# Patient Record
Sex: Female | Born: 1950 | Race: White | Hispanic: No | Marital: Married | Smoking: Current some day smoker
Health system: Southern US, Community
[De-identification: ages and names within clinical notes are randomized; demographics above are authoritative.]

## PROBLEM LIST (undated history)

## (undated) DIAGNOSIS — E7800 Pure hypercholesterolemia, unspecified: Secondary | ICD-10-CM

## (undated) DIAGNOSIS — N816 Rectocele: Secondary | ICD-10-CM

## (undated) DIAGNOSIS — I10 Essential (primary) hypertension: Secondary | ICD-10-CM

## (undated) DIAGNOSIS — N951 Menopausal and female climacteric states: Secondary | ICD-10-CM

## (undated) DIAGNOSIS — M8580 Other specified disorders of bone density and structure, unspecified site: Secondary | ICD-10-CM

## (undated) DIAGNOSIS — D219 Benign neoplasm of connective and other soft tissue, unspecified: Secondary | ICD-10-CM

## (undated) DIAGNOSIS — N200 Calculus of kidney: Secondary | ICD-10-CM

## (undated) DIAGNOSIS — IMO0002 Reserved for concepts with insufficient information to code with codable children: Secondary | ICD-10-CM

## (undated) HISTORY — DX: Rectocele: N81.6

## (undated) HISTORY — DX: Reserved for concepts with insufficient information to code with codable children: IMO0002

## (undated) HISTORY — DX: Pure hypercholesterolemia, unspecified: E78.00

## (undated) HISTORY — DX: Menopausal and female climacteric states: N95.1

## (undated) HISTORY — PX: BREAST SURGERY: SHX581

## (undated) HISTORY — PX: AUGMENTATION MAMMAPLASTY: SUR837

## (undated) HISTORY — PX: COLONOSCOPY: SHX174

## (undated) HISTORY — DX: Other specified disorders of bone density and structure, unspecified site: M85.80

## (undated) HISTORY — PX: TONSILLECTOMY: SUR1361

## (undated) HISTORY — PX: OTHER SURGICAL HISTORY: SHX169

## (undated) HISTORY — PX: WISDOM TOOTH EXTRACTION: SHX21

## (undated) HISTORY — PX: TUBAL LIGATION: SHX77

## (undated) HISTORY — DX: Benign neoplasm of connective and other soft tissue, unspecified: D21.9

---

## 1990-02-22 HISTORY — PX: VAGINAL HYSTERECTOMY: SUR661

## 1997-08-09 ENCOUNTER — Ambulatory Visit (HOSPITAL_COMMUNITY): Admission: RE | Admit: 1997-08-09 | Discharge: 1997-08-09 | Payer: Self-pay | Admitting: Gastroenterology

## 1998-02-13 ENCOUNTER — Encounter: Payer: Self-pay | Admitting: Obstetrics and Gynecology

## 1998-02-13 ENCOUNTER — Ambulatory Visit (HOSPITAL_COMMUNITY): Admission: RE | Admit: 1998-02-13 | Discharge: 1998-02-13 | Payer: Self-pay | Admitting: Obstetrics and Gynecology

## 1998-12-30 ENCOUNTER — Other Ambulatory Visit: Admission: RE | Admit: 1998-12-30 | Discharge: 1998-12-30 | Payer: Self-pay | Admitting: Obstetrics and Gynecology

## 1999-02-20 ENCOUNTER — Ambulatory Visit (HOSPITAL_COMMUNITY): Admission: RE | Admit: 1999-02-20 | Discharge: 1999-02-20 | Payer: Self-pay | Admitting: Obstetrics and Gynecology

## 1999-02-20 ENCOUNTER — Encounter: Payer: Self-pay | Admitting: Obstetrics and Gynecology

## 1999-02-23 HISTORY — PX: TOTAL HIP ARTHROPLASTY: SHX124

## 2000-02-22 ENCOUNTER — Encounter: Payer: Self-pay | Admitting: Obstetrics and Gynecology

## 2000-02-22 ENCOUNTER — Ambulatory Visit (HOSPITAL_COMMUNITY): Admission: RE | Admit: 2000-02-22 | Discharge: 2000-02-22 | Payer: Self-pay | Admitting: Obstetrics and Gynecology

## 2001-03-06 ENCOUNTER — Other Ambulatory Visit: Admission: RE | Admit: 2001-03-06 | Discharge: 2001-03-06 | Payer: Self-pay | Admitting: Obstetrics and Gynecology

## 2001-03-09 ENCOUNTER — Ambulatory Visit (HOSPITAL_COMMUNITY): Admission: RE | Admit: 2001-03-09 | Discharge: 2001-03-09 | Payer: Self-pay | Admitting: Obstetrics and Gynecology

## 2001-03-09 ENCOUNTER — Encounter: Payer: Self-pay | Admitting: Obstetrics and Gynecology

## 2002-03-07 ENCOUNTER — Other Ambulatory Visit: Admission: RE | Admit: 2002-03-07 | Discharge: 2002-03-07 | Payer: Self-pay | Admitting: Obstetrics and Gynecology

## 2002-03-13 ENCOUNTER — Encounter: Payer: Self-pay | Admitting: Obstetrics and Gynecology

## 2002-03-13 ENCOUNTER — Ambulatory Visit (HOSPITAL_COMMUNITY): Admission: RE | Admit: 2002-03-13 | Discharge: 2002-03-13 | Payer: Self-pay | Admitting: Obstetrics and Gynecology

## 2003-02-23 HISTORY — PX: OTHER SURGICAL HISTORY: SHX169

## 2003-03-18 ENCOUNTER — Other Ambulatory Visit: Admission: RE | Admit: 2003-03-18 | Discharge: 2003-03-18 | Payer: Self-pay | Admitting: Obstetrics and Gynecology

## 2003-03-19 ENCOUNTER — Ambulatory Visit (HOSPITAL_COMMUNITY): Admission: RE | Admit: 2003-03-19 | Discharge: 2003-03-19 | Payer: Self-pay | Admitting: Obstetrics and Gynecology

## 2004-03-19 ENCOUNTER — Other Ambulatory Visit: Admission: RE | Admit: 2004-03-19 | Discharge: 2004-03-19 | Payer: Self-pay | Admitting: Obstetrics and Gynecology

## 2004-03-30 ENCOUNTER — Ambulatory Visit (HOSPITAL_COMMUNITY): Admission: RE | Admit: 2004-03-30 | Discharge: 2004-03-30 | Payer: Self-pay | Admitting: Obstetrics and Gynecology

## 2005-03-23 ENCOUNTER — Other Ambulatory Visit: Admission: RE | Admit: 2005-03-23 | Discharge: 2005-03-23 | Payer: Self-pay | Admitting: Obstetrics and Gynecology

## 2005-04-05 ENCOUNTER — Ambulatory Visit (HOSPITAL_COMMUNITY): Admission: RE | Admit: 2005-04-05 | Discharge: 2005-04-05 | Payer: Self-pay | Admitting: Obstetrics and Gynecology

## 2005-10-26 ENCOUNTER — Encounter: Admission: RE | Admit: 2005-10-26 | Discharge: 2005-10-26 | Payer: Self-pay | Admitting: Otolaryngology

## 2006-04-19 ENCOUNTER — Ambulatory Visit (HOSPITAL_COMMUNITY): Admission: RE | Admit: 2006-04-19 | Discharge: 2006-04-19 | Payer: Self-pay | Admitting: Obstetrics and Gynecology

## 2006-04-19 ENCOUNTER — Other Ambulatory Visit: Admission: RE | Admit: 2006-04-19 | Discharge: 2006-04-19 | Payer: Self-pay | Admitting: Obstetrics and Gynecology

## 2006-10-05 ENCOUNTER — Ambulatory Visit: Payer: Self-pay | Admitting: Gastroenterology

## 2006-10-19 ENCOUNTER — Ambulatory Visit: Payer: Self-pay | Admitting: Gastroenterology

## 2006-10-19 ENCOUNTER — Encounter: Payer: Self-pay | Admitting: Gastroenterology

## 2007-04-28 ENCOUNTER — Ambulatory Visit (HOSPITAL_COMMUNITY): Admission: RE | Admit: 2007-04-28 | Discharge: 2007-04-28 | Payer: Self-pay | Admitting: Obstetrics and Gynecology

## 2007-05-03 ENCOUNTER — Other Ambulatory Visit: Admission: RE | Admit: 2007-05-03 | Discharge: 2007-05-03 | Payer: Self-pay | Admitting: Obstetrics and Gynecology

## 2008-05-07 ENCOUNTER — Ambulatory Visit (HOSPITAL_COMMUNITY): Admission: RE | Admit: 2008-05-07 | Discharge: 2008-05-07 | Payer: Self-pay | Admitting: Obstetrics and Gynecology

## 2008-05-21 ENCOUNTER — Other Ambulatory Visit: Admission: RE | Admit: 2008-05-21 | Discharge: 2008-05-21 | Payer: Self-pay | Admitting: Obstetrics and Gynecology

## 2008-05-21 ENCOUNTER — Encounter: Payer: Self-pay | Admitting: Obstetrics and Gynecology

## 2008-05-21 ENCOUNTER — Ambulatory Visit: Payer: Self-pay | Admitting: Obstetrics and Gynecology

## 2009-05-13 ENCOUNTER — Encounter: Admission: RE | Admit: 2009-05-13 | Discharge: 2009-05-13 | Payer: Self-pay | Admitting: Obstetrics and Gynecology

## 2009-05-27 ENCOUNTER — Ambulatory Visit: Payer: Self-pay | Admitting: Obstetrics and Gynecology

## 2009-05-27 ENCOUNTER — Other Ambulatory Visit: Admission: RE | Admit: 2009-05-27 | Discharge: 2009-05-27 | Payer: Self-pay | Admitting: Obstetrics and Gynecology

## 2010-03-15 ENCOUNTER — Encounter: Payer: Self-pay | Admitting: Obstetrics and Gynecology

## 2010-04-10 ENCOUNTER — Other Ambulatory Visit: Payer: Self-pay | Admitting: Obstetrics and Gynecology

## 2010-04-10 DIAGNOSIS — Z1231 Encounter for screening mammogram for malignant neoplasm of breast: Secondary | ICD-10-CM

## 2010-05-19 ENCOUNTER — Ambulatory Visit
Admission: RE | Admit: 2010-05-19 | Discharge: 2010-05-19 | Disposition: A | Discharge status: Home / Self Care | Payer: BC Managed Care – PPO | Source: Ambulatory Visit | Attending: Obstetrics and Gynecology | Admitting: Obstetrics and Gynecology

## 2010-05-19 DIAGNOSIS — Z1231 Encounter for screening mammogram for malignant neoplasm of breast: Secondary | ICD-10-CM

## 2010-05-21 ENCOUNTER — Other Ambulatory Visit: Payer: Self-pay | Admitting: Family Medicine

## 2010-05-21 DIAGNOSIS — R928 Other abnormal and inconclusive findings on diagnostic imaging of breast: Secondary | ICD-10-CM

## 2010-05-26 ENCOUNTER — Inpatient Hospital Stay: Admission: RE | Admit: 2010-05-26 | Payer: BC Managed Care – PPO | Source: Ambulatory Visit

## 2010-06-09 ENCOUNTER — Other Ambulatory Visit (HOSPITAL_COMMUNITY)
Admission: RE | Admit: 2010-06-09 | Discharge: 2010-06-09 | Disposition: A | Discharge status: Home / Self Care | Payer: BC Managed Care – PPO | Source: Ambulatory Visit | Attending: Obstetrics and Gynecology | Admitting: Obstetrics and Gynecology

## 2010-06-09 ENCOUNTER — Other Ambulatory Visit: Payer: Self-pay | Admitting: Obstetrics and Gynecology

## 2010-06-09 ENCOUNTER — Encounter (INDEPENDENT_AMBULATORY_CARE_PROVIDER_SITE_OTHER): Payer: BC Managed Care – PPO | Admitting: Obstetrics and Gynecology

## 2010-06-09 DIAGNOSIS — Z01419 Encounter for gynecological examination (general) (routine) without abnormal findings: Secondary | ICD-10-CM

## 2010-06-09 DIAGNOSIS — B373 Candidiasis of vulva and vagina: Secondary | ICD-10-CM

## 2010-06-09 DIAGNOSIS — Z124 Encounter for screening for malignant neoplasm of cervix: Secondary | ICD-10-CM | POA: Insufficient documentation

## 2010-06-09 DIAGNOSIS — R82998 Other abnormal findings in urine: Secondary | ICD-10-CM

## 2010-09-29 ENCOUNTER — Telehealth: Payer: Self-pay

## 2010-09-29 MED ORDER — NONFORMULARY OR COMPOUNDED ITEM: Status: DC

## 2010-09-29 MED ORDER — KETOCONAZOLE 200 MG PO TABS: 200.0000 mg | ORAL_TABLET | Freq: Two times a day (BID) | ORAL | Status: AC

## 2010-09-29 NOTE — Telephone Encounter (Signed)
PT. NOTOFIED AND RX'S CALLED TO [facility]. ALL INFO. PUT IN PTS. WORK VMAIL TOO PER HER REQUEST [id number] [id number]

## 2010-09-29 NOTE — Telephone Encounter (Signed)
PT. C-O PERSISTENT YEAST SYMPTOMS-EXTERNAL ITCHING & WHITE -TO -YELLOWISH DISCHARGE, SLIGHT ODOR. TERAZOL 7 & DIFLUCAN ALWAYS HELP BUT SYMPTOMS ALWAYS RETURN. REQUESTING STRONGER GENERIC RX. IF OV NEEDED, THIS IS HER BUSIEST TIME OF YEAR & WOULDN'T BE ABLE TO SEE YOU UNTIL NEXT WEEK.

## 2010-09-29 NOTE — Telephone Encounter (Signed)
  Please treat the patient with the Nizoral 200 mg twice a day for 7 days. Also give her generic mytrex cream to use twice a day. Also suggest to start oral rephresh 3 times a week.

## 2010-10-27 ENCOUNTER — Other Ambulatory Visit: Payer: Self-pay | Admitting: Obstetrics and Gynecology

## 2010-12-09 ENCOUNTER — Telehealth: Payer: Self-pay | Admitting: *Deleted

## 2010-12-09 NOTE — Telephone Encounter (Signed)
Pt called c/o chronic yeast. I advised OV since she hasn t  been here since April. She stated has been treated every so often for 2 years and I again advised it would be best to have OV due to her symptoms. She stated cant come in til 10/29-10/31. I transferred to appts. KW

## 2010-12-15 DIAGNOSIS — M8580 Other specified disorders of bone density and structure, unspecified site: Secondary | ICD-10-CM | POA: Insufficient documentation

## 2010-12-15 DIAGNOSIS — N951 Menopausal and female climacteric states: Secondary | ICD-10-CM | POA: Insufficient documentation

## 2010-12-15 DIAGNOSIS — IMO0002 Reserved for concepts with insufficient information to code with codable children: Secondary | ICD-10-CM | POA: Insufficient documentation

## 2010-12-15 DIAGNOSIS — D219 Benign neoplasm of connective and other soft tissue, unspecified: Secondary | ICD-10-CM | POA: Insufficient documentation

## 2010-12-15 DIAGNOSIS — N816 Rectocele: Secondary | ICD-10-CM | POA: Insufficient documentation

## 2010-12-21 ENCOUNTER — Ambulatory Visit (INDEPENDENT_AMBULATORY_CARE_PROVIDER_SITE_OTHER): Payer: BC Managed Care – PPO | Admitting: Obstetrics and Gynecology

## 2010-12-21 ENCOUNTER — Encounter: Payer: Self-pay | Admitting: Obstetrics and Gynecology

## 2010-12-21 DIAGNOSIS — R300 Dysuria: Secondary | ICD-10-CM

## 2010-12-21 DIAGNOSIS — R1031 Right lower quadrant pain: Secondary | ICD-10-CM

## 2010-12-21 DIAGNOSIS — N898 Other specified noninflammatory disorders of vagina: Secondary | ICD-10-CM

## 2010-12-21 DIAGNOSIS — R82998 Other abnormal findings in urine: Secondary | ICD-10-CM

## 2010-12-21 DIAGNOSIS — B373 Candidiasis of vulva and vagina: Secondary | ICD-10-CM

## 2010-12-21 NOTE — Progress Notes (Signed)
Patient came to see me today for problem visit. She continues to have what she thinks is recurrent yeast infections. She has tried both Diflucan and terconazole. She is also noticed today some right lower quadrant pain for the first time.  Physical exam: Kennon Portela present. Abdomen: Soft without guarding rebound or masses. Pelvic: External and vaginal within normal limits. Cervix and uterus surgically absent. Adnexa fails to reveal masses. Wet prep positive for yeast. Urinalysis abnormal.  Assessment: #1. Yeast vaginitis #2. Possible UTI #3. Right lower quadrant pain  Plan: Patient treat her with boric acid suppositories 600 mg capsules one at bedtime in the vagina for 2 weeks and then 2-3 times a week for maintenance. Patient to get blood glucose checked at PCP. Call tomorrow for urine culture results. If right lower quadrant pain persists schedule ultrasound.

## 2010-12-23 ENCOUNTER — Ambulatory Visit: Payer: BC Managed Care – PPO | Admitting: Obstetrics and Gynecology

## 2011-05-31 ENCOUNTER — Other Ambulatory Visit: Payer: Self-pay | Admitting: Obstetrics and Gynecology

## 2011-05-31 DIAGNOSIS — Z1231 Encounter for screening mammogram for malignant neoplasm of breast: Secondary | ICD-10-CM

## 2011-06-10 ENCOUNTER — Encounter: Payer: BC Managed Care – PPO | Admitting: Obstetrics and Gynecology

## 2011-06-15 ENCOUNTER — Encounter: Payer: Self-pay | Admitting: Obstetrics and Gynecology

## 2011-06-15 ENCOUNTER — Ambulatory Visit (INDEPENDENT_AMBULATORY_CARE_PROVIDER_SITE_OTHER): Payer: BC Managed Care – PPO | Admitting: Obstetrics and Gynecology

## 2011-06-15 VITALS — BP 124/80 | Ht 60.0 in | Wt 156.0 lb

## 2011-06-15 DIAGNOSIS — E7800 Pure hypercholesterolemia, unspecified: Secondary | ICD-10-CM | POA: Insufficient documentation

## 2011-06-15 DIAGNOSIS — M899 Disorder of bone, unspecified: Secondary | ICD-10-CM

## 2011-06-15 DIAGNOSIS — Z01419 Encounter for gynecological examination (general) (routine) without abnormal findings: Secondary | ICD-10-CM

## 2011-06-15 DIAGNOSIS — M8580 Other specified disorders of bone density and structure, unspecified site: Secondary | ICD-10-CM

## 2011-06-15 MED ORDER — KETOCONAZOLE 200 MG PO TABS: 200.0000 mg | ORAL_TABLET | Freq: Every day | ORAL | Status: AC

## 2011-06-15 MED ORDER — ESTRADIOL 0.5 MG PO TABS: 0.5000 mg | ORAL_TABLET | Freq: Every day | ORAL | Status: DC

## 2011-06-15 MED ORDER — DIAZEPAM 10 MG PO TABS: 10.0000 mg | ORAL_TABLET | Freq: Four times a day (QID) | ORAL | Status: AC | PRN

## 2011-06-15 NOTE — Progress Notes (Signed)
Patient came to see me today for her annual GYN exam. She is doing well on her HRT. She has been able to taper it 3 times a week. She is currently not sexually active. She is having no vaginal bleeding. She is having no pelvic pain. She takes Valium for stress the we provide her with good results. She is scheduled yearly mammogram. Her last bone density was in 2009 and she had low bone mass without an elevated FRAX risk. She takes calcium and vitamin D. She has had no fractures. She does have a rectocele the we've been watching. She will occasionally have trouble eliminating  stool but not enough to require surgery. She has a cystocele which is asymptomatic. She does her lab through her PCP. She has had a persistent heavy cottage cheese like discharge which has been yeast in the past. It is not associated with any itching. It did not respond to Diflucan or terconazole. We tried boric acid but she found it very messy and  Only slightly effective.  HEENT: Within normal limits. Kennon Portela present. Neck: No masses. Supraclavicular lymph nodes: Not enlarged. Breasts: Examined in both sitting and lying position. Symmetrical without skin changes or masses. Abdomen: Soft no masses guarding or rebound. No hernias. Pelvic: External within normal limits. BUS within normal limits. Vaginal examination shows good estrogen effect, 1-1/2 cystocele and second-degree rectocele. Cervix and uterus absent. Adnexa within normal limits. Rectovaginal confirmatory. Extremities within normal limits.  Assessment: #1. Menopausal symptoms #2. Low bone mass 3. Yeast vaginitis #4. Cystocele #5. Rectocele  Plan: Mammogram. Bone density. Nizoral for one week. Continue Estrace half a milligram 3 times a week. Observation of cystocele and rectocele. Continue Valium prescription written.

## 2011-06-16 LAB — URINALYSIS W MICROSCOPIC + REFLEX CULTURE
Bacteria, UA: NONE SEEN
Bilirubin Urine: NEGATIVE
Casts: NONE SEEN
Hgb urine dipstick: NEGATIVE
Ketones, ur: NEGATIVE mg/dL
Leukocytes, UA: NEGATIVE
Protein, ur: NEGATIVE mg/dL
Urobilinogen, UA: 0.2 mg/dL (ref 0.0–1.0)

## 2011-06-22 ENCOUNTER — Inpatient Hospital Stay: Admission: RE | Admit: 2011-06-22 | Payer: BC Managed Care – PPO | Source: Ambulatory Visit

## 2011-06-29 ENCOUNTER — Ambulatory Visit
Admission: RE | Admit: 2011-06-29 | Discharge: 2011-06-29 | Disposition: A | Discharge status: Home / Self Care | Payer: BC Managed Care – PPO | Source: Ambulatory Visit

## 2011-06-29 ENCOUNTER — Ambulatory Visit: Payer: BC Managed Care – PPO

## 2011-06-29 DIAGNOSIS — Z1231 Encounter for screening mammogram for malignant neoplasm of breast: Secondary | ICD-10-CM

## 2011-10-21 ENCOUNTER — Encounter: Payer: Self-pay | Admitting: Gastroenterology

## 2012-02-01 ENCOUNTER — Ambulatory Visit (INDEPENDENT_AMBULATORY_CARE_PROVIDER_SITE_OTHER): Payer: BC Managed Care – PPO | Admitting: Obstetrics and Gynecology

## 2012-02-01 DIAGNOSIS — R229 Localized swelling, mass and lump, unspecified: Secondary | ICD-10-CM

## 2012-02-01 DIAGNOSIS — R2230 Localized swelling, mass and lump, unspecified upper limb: Secondary | ICD-10-CM

## 2012-02-01 DIAGNOSIS — IMO0002 Reserved for concepts with insufficient information to code with codable children: Secondary | ICD-10-CM

## 2012-02-01 DIAGNOSIS — N816 Rectocele: Secondary | ICD-10-CM

## 2012-02-01 DIAGNOSIS — N8111 Cystocele, midline: Secondary | ICD-10-CM

## 2012-02-01 DIAGNOSIS — R1032 Left lower quadrant pain: Secondary | ICD-10-CM

## 2012-02-01 NOTE — Progress Notes (Signed)
Patient came to see me today with 2 concerns. #1 she is noticed in the past month a fullness in both her axilla. She had a normal mammogram in May. She thinks her weight is constant. She has minimal mastodynia bilaterally. She has also noticed off-and-on for the past year some left lower quadrant pain near her ovary. She is status post vaginal hysterectomy done for fibroids. She has no GI or GU symptoms. She does have a rectocele and she is aware of difficulty emptying her rectum.  Exam: Kennon Portela present. Both her breasts were carefully examined in both the sitting and lying position. There are no masses. I appreciate very little axillary thickness but there is clearly no masses.  Pelvic: External within normal limits. Vaginal examination reveals a first-degree cystocele and a second-degree rectocele. Cervix and uterus are surgically absent. Bimanual and rectal fails to reveal masses. There is a suggestion of a left inguinal hernia when she stands.  Assessment: #1. Change in axillary exam. #2. Cystocele/rectocele #3. Left lower quadrant pain #4. Possible left inguinal hernia  Plan: Pelvic ultrasound. Pending results we will probably send her to a surgeon.

## 2012-02-01 NOTE — Patient Instructions (Signed)
Schedule ultrasound

## 2012-02-08 ENCOUNTER — Other Ambulatory Visit: Payer: Self-pay | Admitting: Obstetrics and Gynecology

## 2012-02-08 DIAGNOSIS — R1032 Left lower quadrant pain: Secondary | ICD-10-CM

## 2012-02-09 ENCOUNTER — Ambulatory Visit (INDEPENDENT_AMBULATORY_CARE_PROVIDER_SITE_OTHER): Payer: BC Managed Care – PPO

## 2012-02-09 ENCOUNTER — Ambulatory Visit (INDEPENDENT_AMBULATORY_CARE_PROVIDER_SITE_OTHER): Payer: BC Managed Care – PPO | Admitting: Obstetrics and Gynecology

## 2012-02-09 ENCOUNTER — Other Ambulatory Visit: Payer: Self-pay | Admitting: Obstetrics and Gynecology

## 2012-02-09 DIAGNOSIS — K5900 Constipation, unspecified: Secondary | ICD-10-CM

## 2012-02-09 DIAGNOSIS — R1032 Left lower quadrant pain: Secondary | ICD-10-CM

## 2012-02-09 DIAGNOSIS — N83339 Acquired atrophy of ovary and fallopian tube, unspecified side: Secondary | ICD-10-CM

## 2012-02-09 MED ORDER — AZITHROMYCIN 250 MG PO TABS: ORAL_TABLET | ORAL | Status: DC

## 2012-02-09 NOTE — Progress Notes (Signed)
Patient came back today for a ultrasound due  to left lower quadrant pain. Her uterus is surgically absent. Her right ovary is normal. There is no adnexal mass on the left but the ovary could not be seen. Part of the reason for this was increase bowel activity with shadowing from bowel. Obviously a postmenopausal ovary is not always seen. Her cul-de-sac was free of fluid.  Assessment: Left lower quadrant pain. Possible irritable bowel syndrome. Possible left inguinal hernia( see last office visit).  Plan: Surgical consult to rule out hernia. If this is not the reason for her pain she will discuss GI function with her medical doctor. The patient was developing an upper respiratory infection with significant nasal discharge. She was treated with a Z-Pak. She will see her PCP if it persists.

## 2012-02-09 NOTE — Patient Instructions (Addendum)
Make an appointment to see Dr. Abbey Chatters to get his opinion on whether you have her hernia. He is in [place] [facility].

## 2012-03-09 ENCOUNTER — Other Ambulatory Visit: Payer: Self-pay | Admitting: Gynecology

## 2012-03-09 ENCOUNTER — Other Ambulatory Visit: Payer: Self-pay

## 2012-06-01 ENCOUNTER — Encounter: Payer: Self-pay | Admitting: Gastroenterology

## 2012-06-02 ENCOUNTER — Other Ambulatory Visit: Payer: Self-pay | Admitting: Family Medicine

## 2012-06-02 DIAGNOSIS — Z1231 Encounter for screening mammogram for malignant neoplasm of breast: Secondary | ICD-10-CM

## 2012-06-06 ENCOUNTER — Encounter: Payer: Self-pay | Admitting: Gastroenterology

## 2012-06-20 ENCOUNTER — Encounter: Payer: BC Managed Care – PPO | Admitting: Gynecology

## 2012-07-04 ENCOUNTER — Encounter: Payer: Self-pay | Admitting: Gynecology

## 2012-07-04 ENCOUNTER — Ambulatory Visit (INDEPENDENT_AMBULATORY_CARE_PROVIDER_SITE_OTHER): Payer: BC Managed Care – PPO | Admitting: Gynecology

## 2012-07-04 VITALS — BP 130/86 | Ht 60.0 in | Wt 156.0 lb

## 2012-07-04 DIAGNOSIS — M8580 Other specified disorders of bone density and structure, unspecified site: Secondary | ICD-10-CM

## 2012-07-04 DIAGNOSIS — N8110 Cystocele, unspecified: Secondary | ICD-10-CM

## 2012-07-04 DIAGNOSIS — N816 Rectocele: Secondary | ICD-10-CM

## 2012-07-04 DIAGNOSIS — N898 Other specified noninflammatory disorders of vagina: Secondary | ICD-10-CM

## 2012-07-04 DIAGNOSIS — M899 Disorder of bone, unspecified: Secondary | ICD-10-CM

## 2012-07-04 DIAGNOSIS — N8111 Cystocele, midline: Secondary | ICD-10-CM

## 2012-07-04 DIAGNOSIS — Z1159 Encounter for screening for other viral diseases: Secondary | ICD-10-CM

## 2012-07-04 MED ORDER — NONFORMULARY OR COMPOUNDED ITEM: Status: DC

## 2012-07-04 MED ORDER — DIAZEPAM 10 MG PO TABS: 10.0000 mg | ORAL_TABLET | Freq: Four times a day (QID) | ORAL | Status: DC | PRN

## 2012-07-04 NOTE — Patient Instructions (Addendum)

## 2012-07-05 NOTE — Progress Notes (Signed)
Kathy [name] May 01, 1950 [id number]   History:    62 y.o. presented to the office today for her annual exam. Patient states that for quite some time to that she has had on and off yeast infections but since she stopped eating-year-old her symptoms haven't improved immensely. She had been on estrogen replacement therapy for 10 years for menopausal symptoms for which she was taken Estrace half a milligram 3 times a day and started to taper off. Patient scheduled for mammogram next week. 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 Patient has a history of a first degree cystocele and second-degree rectocele and has been asymptomatic. Her colonoscopy is scheduled for this June she states her last bone was normal 6 years ago. Patient takes Xanax 0.25 mg when necessary anxiety. Her last bone density study was in 2009 whereby her lowest T score was in the AP spine with a value of -1.6. Patient denies any prior history of abnormal Pap smears. Patient states her shingles, Pneumovax and dTap Vaccine are up-to-date.  Past medical history,surgical history, family history and social history were all reviewed and documented in the EPIC chart.  Gynecologic History No LMP recorded. Patient has had a hysterectomy. Contraception: status post hysterectomy Last Pap: 2012. Results were: normal Last mam normal 2013  Obstetric History OB History   Grav Para Term Preterm Abortions TAB SAB Ect Mult Living   3 2   1     2      # Outc Date GA Lbr Len/2nd Wgt Sex Del Anes PTL Lv   1 PAR            2 PAR            3 ABT                ROS: A ROS was performed and pertinent positives and negatives are included in the history.  GENERAL: No fevers or chills. HEENT: No change in vision, no earache, sore throat or sinus congestion. NECK: No pain or stiffness. CARDIOVASCULAR: No chest pain or pressure. No palpitations. PULMONARY: No shortness of breath, cough or wheeze.  GASTROINTESTINAL: No abdominal pain, nausea, vomiting or diarrhea, melena or bright red blood per rectum. GENITOURINARY: No urinary frequency, urgency, hesitancy or dysuria. MUSCULOSKELETAL: No joint or muscle pain, no back pain, no recent trauma. DERMATOLOGIC: No rash, no itching, no lesions. ENDOCRINE: No polyuria, polydipsia, no heat or cold intolerance. No recent change in weight. HEMATOLOGICAL: No anemia or easy bruising or bleeding. NEUROLOGIC: No headache, seizures, numbness, tingling or weakness. PSYCHIATRIC: No depression, no loss of interest in normal activity or change in sleep pattern.     Exam: chaperone present  BP 130/86  Ht 5' (1.524 m)  Wt 156 lb (70.761 kg)  BMI 30.47 kg/m2  Body mass index is 30.47 kg/(m^2).  General appearance : Well developed well nourished female. No acute distress HEENT: Neck supple, trachea midline, no carotid bruits, no thyroidmegaly Lungs: Clear to auscultation, no rhonchi or wheezes, or rib retractions  Heart: Regular rate and rhythm, no murmurs or gallops Breast:Examined in sitting and supine position were symmetrical in appearance, no palpable masses or tenderness,  no skin retraction, no nipple inversion, no nipple discharge, no skin discoloration, no axillary or supraclavicular lymphadenopathy Abdomen: no palpable masses or tenderness, no rebound or guarding Extremities: no edema or skin discoloration or tenderness  Pelvic:  Bartholin, Urethra, Skene Glands: Within normal limits  Vagina: No gross lesions or discharge, atrophic changes, first degree cystocele second-degree rectocele  Cervix: absent  Uterus Absent  Adnexa  Without masses or tenderness  Anus and perineum  normal   Rectovaginal  normal sphincter tone without palpated masses or tenderness             Hemoccult Cards provided     Assessment/Plan:  62 y.o. female for annual exam who was reminded to followup with her mammogram in the next few weeks. She also will  need to schedule a bone density study since is overdue. She was reminded to followup with her colonoscopy in June. Hemoccult cards were provided her to submit to the office for testing. We discussed importance of calcium vitamin D and weightbearing exercises for osteoporosis prevention. No Pap smear done today the new screening guidelines discussed.  New CDC guidelines is recommending patients be tested once in her lifetime for hepatitis C antibody who were born between [birth year] through 1967. This was discussed with the patient today and has agreed to be tested today.  For her vaginal atrophy she was prescribed vaginal estradiol 0.02% 1 mL prefilled applicator twice a week. The risk benefits and pros and cons were discussed as well.  Ok Edwards MD, 8:03 AM 07/05/2012

## 2012-07-07 ENCOUNTER — Encounter: Payer: Self-pay | Admitting: Obstetrics and Gynecology

## 2012-07-11 ENCOUNTER — Other Ambulatory Visit: Payer: Self-pay | Admitting: Family Medicine

## 2012-07-11 ENCOUNTER — Ambulatory Visit: Payer: BC Managed Care – PPO

## 2012-07-11 DIAGNOSIS — Z1231 Encounter for screening mammogram for malignant neoplasm of breast: Secondary | ICD-10-CM

## 2012-07-12 ENCOUNTER — Ambulatory Visit (AMBULATORY_SURGERY_CENTER): Payer: BC Managed Care – PPO | Admitting: *Deleted

## 2012-07-12 VITALS — Ht 61.0 in | Wt 158.2 lb

## 2012-07-12 DIAGNOSIS — Z1211 Encounter for screening for malignant neoplasm of colon: Secondary | ICD-10-CM

## 2012-07-12 MED ORDER — MOVIPREP 100 G PO SOLR: ORAL | Status: DC

## 2012-07-13 ENCOUNTER — Encounter: Payer: Self-pay | Admitting: Gastroenterology

## 2012-07-26 ENCOUNTER — Encounter: Payer: BC Managed Care – PPO | Admitting: Gastroenterology

## 2012-08-28 ENCOUNTER — Ambulatory Visit (AMBULATORY_SURGERY_CENTER): Payer: BC Managed Care – PPO | Admitting: Gastroenterology

## 2012-08-28 ENCOUNTER — Encounter: Payer: Self-pay | Admitting: Gastroenterology

## 2012-08-28 VITALS — BP 140/85 | HR 61 | Temp 96.8°F | Resp 15 | Ht 61.0 in | Wt 158.0 lb

## 2012-08-28 DIAGNOSIS — D126 Benign neoplasm of colon, unspecified: Secondary | ICD-10-CM

## 2012-08-28 DIAGNOSIS — K5730 Diverticulosis of large intestine without perforation or abscess without bleeding: Secondary | ICD-10-CM

## 2012-08-28 DIAGNOSIS — Z86010 Personal history of colonic polyps: Secondary | ICD-10-CM

## 2012-08-28 DIAGNOSIS — Z1211 Encounter for screening for malignant neoplasm of colon: Secondary | ICD-10-CM

## 2012-08-28 MED ORDER — SODIUM CHLORIDE 0.9 % IV SOLN: 500.0000 mL | INTRAVENOUS | Status: DC

## 2012-08-28 NOTE — Progress Notes (Signed)
Procedure ends, to recovery, report given and VSS. 

## 2012-08-28 NOTE — Patient Instructions (Addendum)
YOU HAD AN ENDOSCOPIC PROCEDURE TODAY AT THE [name]: Refer to the procedure report that was given to you for any specific questions about what was found during the examination.  If the procedure report does not answer your questions, please call your gastroenterologist to clarify.  If you requested that your care partner not be given the details of your procedure findings, then the procedure report has been included in a sealed envelope for you to review at your convenience later.  YOU SHOULD EXPECT: Some feelings of bloating in the abdomen. Passage of more gas than usual.  Walking can help get rid of the air that was put into your GI tract during the procedure and reduce the bloating. If you had a lower endoscopy (such as a colonoscopy or flexible sigmoidoscopy) you may notice spotting of blood in your stool or on the toilet paper. If you underwent a bowel prep for your procedure, then you may not have a normal bowel movement for a few days.  DIET: Your first meal following the procedure should be a light meal and then it is ok to progress to your normal diet.  A half-sandwich or bowl of soup is an example of a good first meal.  Heavy or fried foods are harder to digest and may make you feel nauseous or bloated.  Likewise meals heavy in dairy and vegetables can cause extra gas to form and this can also increase the bloating.  Drink plenty of fluids but you should avoid alcoholic beverages for 24 hours.  ACTIVITY: Your care partner should take you home directly after the procedure.  You should plan to take it easy, moving slowly for the rest of the day.  You can resume normal activity the day after the procedure however you should NOT DRIVE or use heavy machinery for 24 hours (because of the sedation medicines used during the test).    SYMPTOMS TO REPORT IMMEDIATELY: A gastroenterologist can be reached at any hour.  During normal business hours, 8:30 AM to 5:00 PM Monday through Friday,  call [phone].  After hours and on weekends, please call the GI answering service at [phone] who will take a message and have the physician on call contact you.   Following lower endoscopy (colonoscopy or flexible sigmoidoscopy):  Excessive amounts of blood in the stool  Significant tenderness or worsening of abdominal pains  Swelling of the abdomen that is new, acute  Fever of 100F or higher   FOLLOW UP: If any biopsies were taken you will be contacted by phone or by letter within the next 1-3 weeks.  Call your gastroenterologist if you have not heard about the biopsies in 3 weeks.  Our staff will call the home number listed on your records the next business day following your procedure to check on you and address any questions or concerns that you may have at that time regarding the information given to you following your procedure. This is a courtesy call and so if there is no answer at the home number and we have not heard from you through the emergency physician on call, we will assume that you have returned to your regular daily activities without incident.  SIGNATURES/CONFIDENTIALITY: You and/or your care partner have signed paperwork which will be entered into your electronic medical record.  These signatures attest to the fact that that the information above on your After Visit Summary has been reviewed and is understood.  Full responsibility of the confidentiality of   this discharge information lies with you and/or your care-partner.   Resume medications. Information given on polyps,diverticulosis and high fiber diet with discharge instructions. 

## 2012-08-28 NOTE — Progress Notes (Signed)
Called to room to assist during endoscopic procedure.  Patient ID and intended procedure confirmed with present staff. Received instructions for my participation in the procedure from the performing physician.  

## 2012-08-28 NOTE — Op Note (Signed)
[facility] [address] [place], [address]   COLONOSCOPY PROCEDURE REPORT  PATIENT: Kathy [name], Kathy [name].  MR#: [id number] BIRTHDATE: 1950-04-27 , 62  yrs. old GENDER: Female ENDOSCOPIST: Mardella Layman, MD, [facility] REFERRED BY: PROCEDURE DATE:  08/28/2012 PROCEDURE:   Colonoscopy with biopsy ASA CLASS:   Class II INDICATIONS:Patient's personal history of adenomatous colon polyps.  MEDICATIONS: Propofol (Diprivan) 240 mg IV  DESCRIPTION OF PROCEDURE:   After the risks and benefits and of the procedure were explained, informed consent was obtained.  A digital rectal exam revealed no abnormalities of the rectum.    The LB [id number]  endoscope was introduced through the anus and advanced to the cecum, which was identified by both the appendix and ileocecal valve .  The quality of the prep was adequate. .  The instrument was then slowly withdrawn as the colon was fully examined.     COLON FINDINGS: There was mild scattered diverticulosis noted.   The colon was redundant.  Manual abdominal counter-pressure was used to reach the cecum.   Multiple diminutive smooth flat polyps were found in the rectum.  Multiple biopsies were performed.   The colon was otherwise normal.  There was no diverticulosis, inflammation, polyps or cancers unless previously stated.     Retroflexed views revealed no abnormalities.     The scope was then withdrawn from the patient and the procedure completed.  COMPLICATIONS: There were no complications. ENDOSCOPIC IMPRESSION: 1.   There was mild diverticulosis noted 2.   The colon was redundant 3.   Multiple diminutive flat polyps were found in the rectum; multiple biopsies were performed   ?? hyperplastic polyps. 4.   The colon was otherwise normal  RECOMMENDATIONS: 1.  Repeat colonoscopy in 5 years if polyp adenomatous; otherwise 10 years 2.  Continue current medications 3.  High fiber diet   REPEAT  EXAM:  cc:  _______________________________ eSignedMardella Layman, MD, [facility] 08/28/2012 9:31 AM     PATIENT NAME:  [name]. MR#: [id number]

## 2012-08-28 NOTE — Progress Notes (Signed)
Patient did not experience any of the following events: a burn prior to discharge; a fall within the facility; wrong site/side/patient/procedure/implant event; or a hospital transfer or hospital admission upon discharge from the facility. (G8907) Patient did not have preoperative order for IV antibiotic SSI prophylaxis. (G8918)  

## 2012-08-29 ENCOUNTER — Telehealth: Payer: Self-pay

## 2012-08-29 NOTE — Telephone Encounter (Signed)
Phone number not in service

## 2012-09-01 ENCOUNTER — Encounter: Payer: Self-pay | Admitting: Gastroenterology

## 2012-09-21 ENCOUNTER — Telehealth: Payer: Self-pay | Admitting: Gastroenterology

## 2012-09-21 NOTE — Telephone Encounter (Signed)
Pt is unhappy about her bill for her recent COLON done on 08/28/12. Her bill is for $1200, I believe and she states she is not going to pay it. She states it says she's got Diverticulitis and she's never been told anything like that before. Pt states she never got a report after her procedure or was told anything. I asked if she received her Pre Cert letter and she stated no. After a long conversation, informed pt I will send her her 3 COLON reports that I have and she may call when she receives them and we will discuss them and she stated understanding. 1st COLON 01/02/2001 that was normal except she has a family hx of polyps. 2nd COLON 10/19/2006 polyps found meaning every COLON after will be diagnostic and not screening. Recent COLON 08/28/12 was diagnostic and mild diverticulosis and polyps found. Mailed info to pt.

## 2012-10-26 ENCOUNTER — Telehealth: Payer: Self-pay | Admitting: Gastroenterology

## 2012-10-27 NOTE — Telephone Encounter (Signed)
Called pt to speak about her claim and she thanked me for calling, but she is with a client and can't talk now. She did receive my information I mailed per per 09/21/12. She will either call me back or check with her insurance.

## 2012-12-11 ENCOUNTER — Telehealth: Payer: Self-pay | Admitting: *Deleted

## 2012-12-11 MED ORDER — ESTRADIOL 1 MG PO TABS: 1.0000 mg | ORAL_TABLET | Freq: Every day | ORAL | Status: DC

## 2012-12-11 NOTE — Telephone Encounter (Signed)
Please tell her that I just got from the [facility] in [place] and would like to share with her several new treatment options for the menopause. Make a consult appointment for her.

## 2012-12-11 NOTE — Telephone Encounter (Signed)
Pt is in [place] and wont be able to make appointment until 3-4 weeks. This time of year is very busy. She asked could you please give rx, she cant wait 3-4 week from now. Please advise

## 2012-12-11 NOTE — Telephone Encounter (Signed)
Please call her and prescription for Estrace 1 mg by mouth daily #30  1  refill and  appointment to see me in 3-4 weeks

## 2012-12-11 NOTE — Telephone Encounter (Signed)
rx sent, pt informed.  

## 2012-12-11 NOTE — Telephone Encounter (Signed)
Pt called c/o terrible hot flashes, night sweats, trouble with sleeping, pt would to start back on HRT. Please advise

## 2013-04-11 ENCOUNTER — Other Ambulatory Visit: Payer: Self-pay | Admitting: Gynecology

## 2013-06-05 ENCOUNTER — Other Ambulatory Visit: Payer: Self-pay | Admitting: Family Medicine

## 2013-06-05 DIAGNOSIS — Z1231 Encounter for screening mammogram for malignant neoplasm of breast: Secondary | ICD-10-CM

## 2013-07-10 ENCOUNTER — Ambulatory Visit (INDEPENDENT_AMBULATORY_CARE_PROVIDER_SITE_OTHER): Payer: BC Managed Care – PPO | Admitting: Gynecology

## 2013-07-10 ENCOUNTER — Encounter: Payer: Self-pay | Admitting: Gynecology

## 2013-07-10 VITALS — BP 126/82 | Ht 60.25 in | Wt 153.0 lb

## 2013-07-10 DIAGNOSIS — F411 Generalized anxiety disorder: Secondary | ICD-10-CM

## 2013-07-10 DIAGNOSIS — N816 Rectocele: Secondary | ICD-10-CM

## 2013-07-10 DIAGNOSIS — N952 Postmenopausal atrophic vaginitis: Secondary | ICD-10-CM

## 2013-07-10 DIAGNOSIS — M8580 Other specified disorders of bone density and structure, unspecified site: Secondary | ICD-10-CM

## 2013-07-10 DIAGNOSIS — M949 Disorder of cartilage, unspecified: Secondary | ICD-10-CM

## 2013-07-10 DIAGNOSIS — F419 Anxiety disorder, unspecified: Secondary | ICD-10-CM

## 2013-07-10 DIAGNOSIS — M899 Disorder of bone, unspecified: Secondary | ICD-10-CM

## 2013-07-10 MED ORDER — NONFORMULARY OR COMPOUNDED ITEM: Status: DC

## 2013-07-10 MED ORDER — DIAZEPAM 10 MG PO TABS: 10.0000 mg | ORAL_TABLET | Freq: Four times a day (QID) | ORAL | Status: DC | PRN

## 2013-07-10 NOTE — Progress Notes (Signed)
SIGNA CHEEK 1950-10-15 [id number]   History:    63 y.o.  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 Patient has a history of a first degree cystocele and second-degree rectocele and has complaining now of having to insert her finger to completely evacuate. She is occasionally sexually active she has had 2 vaginal deliveries in the past. She is completely off her hormone replacement therapy now she takes Valium 10 mg when necessary for anxiety. She is overdue for her bone density study the last one was in 2009 which demonstrated her lowest T score was in the AP spine with a value of -1.6 with normal FRAX.  She had a colonoscopy this year and she has informed me that they found benign polyps.  Patient's PCP is Dr. Maceo Pro   Past medical history,surgical history, family history and social history were all reviewed and documented in the EPIC chart.  Gynecologic History No LMP recorded. Patient has had a hysterectomy. Contraception: status post hysterectomy Last Pap: 2012. Results were: normal Last mammogram: 2014. Results were: normal  Obstetric History OB History  Gravida Para Term Preterm AB SAB TAB Ectopic Multiple Living  3 2   1     2     # Outcome Date GA Lbr Len/2nd Weight Sex Delivery Anes PTL Lv  3 ABT           2 PAR           1 PAR                ROS: A ROS was performed and pertinent positives and negatives are included in the history.  GENERAL: No fevers or chills. HEENT: No change in vision, no earache, sore throat or sinus congestion. NECK: No pain or stiffness. CARDIOVASCULAR: No chest pain or pressure. No palpitations. PULMONARY: No shortness of breath, cough or wheeze. GASTROINTESTINAL: No abdominal pain, nausea, vomiting or diarrhea, melena or bright red blood per rectum. GENITOURINARY: No urinary frequency, urgency, hesitancy or dysuria. MUSCULOSKELETAL: No joint or muscle pain, no back pain, no  recent trauma. DERMATOLOGIC: No rash, no itching, no lesions. ENDOCRINE: No polyuria, polydipsia, no heat or cold intolerance. No recent change in weight. HEMATOLOGICAL: No anemia or easy bruising or bleeding. NEUROLOGIC: No headache, seizures, numbness, tingling or weakness. PSYCHIATRIC: No depression, no loss of interest in normal activity or change in sleep pattern.     Exam: chaperone present  BP 126/82  Ht 5' 0.25" (1.53 m)  Wt 153 lb (69.4 kg)  BMI 29.65 kg/m2  Body mass index is 29.65 kg/(m^2).  General appearance : Well developed well nourished female. No acute distress HEENT: Neck supple, trachea midline, no carotid bruits, no thyroidmegaly Lungs: Clear to auscultation, no rhonchi or wheezes, or rib retractions  Heart: Regular rate and rhythm, no murmurs or gallops Breast:Examined in sitting and supine position were symmetrical in appearance, no palpable masses or tenderness,  no skin retraction, no nipple inversion, no nipple discharge, no skin discoloration, no axillary or supraclavicular lymphadenopathy Abdomen: no palpable masses or tenderness, no rebound or guarding Extremities: no edema or skin discoloration or tenderness  Pelvic:  Bartholin, Urethra, Skene Glands: Within normal limits             Vagina: First degree cystocele second degree rectocele vaginal atrophy  Cervix: Absent  Uterus absent  Adnexa  Without masses or tenderness  Anus and perineum  normal   Rectovaginal  normal sphincter  tone without palpated masses or tenderness             Hemoccult PCP provides    Assessment/Plan:  63 y.o. female with first-degree cystocele and symptomatic second-degree rectocele along with history of constipation. Patient will be placed on MiraLax 1 tablespoon to take daily. She will be started on vaginal estrogen to apply twice a week and she will return back to the office in 6 months for followup exam. I've given her literature information on cystocele and rectocele and the  vent and in 6 months we may need to proceed with a rectocele repair. She has silicone implants and has a mammogram scheduled in the next week have recommended that she requests for a three-dimensional mammogram. A requisition schedule her bone density study was also provided. Pap smear was not done today in accordance to the new guidelines.    Note: This dictation was prepared with  Dragon/digital dictation along withSmart phrase technology. Any transcriptional errors that result from this process are unintentional.   [name] MD, 4:57 PM 07/10/2013

## 2013-07-11 ENCOUNTER — Telehealth: Payer: Self-pay | Admitting: *Deleted

## 2013-07-11 NOTE — Telephone Encounter (Signed)
Pt called to get the name of the medication to take for constipation. Per note MiraLax 1 tablespoon to take daily pt informed.

## 2013-07-24 ENCOUNTER — Ambulatory Visit: Payer: BC Managed Care – PPO

## 2013-07-24 DIAGNOSIS — Z1231 Encounter for screening mammogram for malignant neoplasm of breast: Secondary | ICD-10-CM

## 2013-08-06 ENCOUNTER — Telehealth: Payer: Self-pay | Admitting: *Deleted

## 2013-08-06 ENCOUNTER — Ambulatory Visit: Payer: BC Managed Care – PPO | Admitting: Gynecology

## 2013-08-06 NOTE — Telephone Encounter (Signed)
Typing error, previous note should read I can see her today at 4 PM when I get back to surgery

## 2013-08-06 NOTE — Telephone Encounter (Signed)
Pt calling to follow up from [place] 07/10/13 still having constipation, taking stool softerns and taking MiraLax 1 tablespoon to take daily. Pt works with [employer] unable to come in today. Pt said front desk said scheduled was booked this week, pt does want OV with you. Please advise

## 2013-08-06 NOTE — Telephone Encounter (Signed)
Front desk will contact patient to schedule.

## 2013-08-06 NOTE — Telephone Encounter (Signed)
She can start with a fleets enema she can by over the counter or Dulcolax suppository twice a day. I would like to prescribe her Linzess 145 mcg to take one tablet 30 mins before first meal. #30 refill x 5. If persist she will need to see her GI.

## 2013-08-06 NOTE — Telephone Encounter (Signed)
Pt called back stating she has blisters in vaginal/rectal area that are painful as well. Okay to have front desk to work in?

## 2013-08-06 NOTE — Telephone Encounter (Signed)
[place] I can see her this afternoon when I get back to her surgery she can be here at 4:00

## 2013-08-07 ENCOUNTER — Ambulatory Visit (INDEPENDENT_AMBULATORY_CARE_PROVIDER_SITE_OTHER): Payer: BC Managed Care – PPO | Admitting: Gynecology

## 2013-08-07 ENCOUNTER — Encounter: Payer: Self-pay | Admitting: Gynecology

## 2013-08-07 VITALS — BP 132/88

## 2013-08-07 DIAGNOSIS — L98499 Non-pressure chronic ulcer of skin of other sites with unspecified severity: Secondary | ICD-10-CM

## 2013-08-07 DIAGNOSIS — K5900 Constipation, unspecified: Secondary | ICD-10-CM

## 2013-08-07 MED ORDER — LIDOCAINE HCL 2 % EX GEL: 1.0000 "application " | CUTANEOUS | Status: DC | PRN

## 2013-08-07 MED ORDER — LINACLOTIDE 145 MCG PO CAPS: 145.0000 ug | ORAL_CAPSULE | Freq: Every day | ORAL | Status: DC

## 2013-08-07 MED ORDER — FLUCONAZOLE 150 MG PO TABS: 150.0000 mg | ORAL_TABLET | Freq: Once | ORAL | Status: DC

## 2013-08-07 MED ORDER — CLOBETASOL PROPIONATE 0.05 % EX CREA: 1.0000 "application " | TOPICAL_CREAM | Freq: Two times a day (BID) | CUTANEOUS | Status: DC

## 2013-08-07 NOTE — Patient Instructions (Signed)
Linaclotide oral capsules What is this medicine? LINACLOTIDE (lin a KLOE tide) is used to treat irritable bowel syndrome (IBS) with constipation as the main problem. It may also be used for relief of chronic constipation. This medicine may be used for other purposes; ask your health care provider or pharmacist if you have questions. COMMON BRAND NAME(S): Linzess What should I tell my health care provider before I take this medicine? They need to know if you have any of these conditions: -history of stool (fecal) impaction -now have diarrhea or have diarrhea often -other medical condition -stomach or intestinal disease, including bowel obstruction or abdominal adhesions -an unusual or allergic reaction to linaclotide, other medicines, foods, dyes, or preservatives -pregnant or trying to get pregnant -breast-feeding How should I use this medicine? Take this medicine by mouth with a glass of water. Follow the directions on the prescription label. Do not cut, crush or chew this medicine. Take on an empty stomach, at least 30 minutes before your first meal of the day. Take your medicine at regular intervals. Do not take your medicine more often than directed. Do not stop taking except on your doctor's advice. A special MedGuide will be given to you by the pharmacist with each prescription and refill. Be sure to read this information carefully each time. Talk to your pediatrician regarding the use of this medicine in children. This medicine is not approved for use in children. Overdosage: If you think you've taken too much of this medicine contact a poison control center or emergency room at once. Overdosage: If you think you have taken too much of this medicine contact a poison control center or emergency room at once. NOTE: This medicine is only for you. Do not share this medicine with others. What if I miss a dose? If you miss a dose, just skip that dose. Wait until your next dose, and take only  that dose. Do not take double or extra doses. What may interact with this medicine? -certain medicines for bowel problems or bladder incontinence (these can cause constipation) This list may not describe all possible interactions. Give your health care provider a list of all the medicines, herbs, non-prescription drugs, or dietary supplements you use. Also tell them if you smoke, drink alcohol, or use illegal drugs. Some items may interact with your medicine. What should I watch for while using this medicine? Visit your doctor for regular check ups. Tell your doctor if your symptoms do not get better or if they get worse. Diarrhea is a common side effect of this medicine. It often begins within 2 weeks of starting this medicine. Stop taking this medicine and call your doctor if you get severe diarrhea. Stop taking this medicine and call your doctor or go to the nearest hospital emergency room right away if you develop unusual or severe stomach-area (abdominal) pain, especially if you also have bright red, bloody stools or black stools that look like tar. What side effects may I notice from receiving this medicine? Side effects that you should report to your doctor or health care professional as soon as possible: -allergic reactions like skin rash, itching or hives, swelling of the face, lips, or tongue -black, tarry stools -bloody or watery diarrhea -new or worsening stomach pain -severe or prolonged diarrhea  Side effects that usually do not require medical attention (Report these to your doctor or health care professional if they continue or are bothersome.): -bloating -gas -loose stools This list may not describe all possible side  effects. Call your doctor for medical advice about side effects. You may report side effects to FDA at 1-800-FDA-1088. Where should I keep my medicine? Keep out of the reach of children. Store at room temperature between 20 and 25 degrees C (68 and 77 degrees F).  Keep this medicine in the original container. Keep tightly closed in a dry place. Do not remove the desiccant packet from the bottle, it helps to protect your medicine from moisture. Throw away any unused medicine after the expiration date. NOTE: This sheet is a summary. It may not cover all possible information. If you have questions about this medicine, talk to your doctor, pharmacist, or health care provider.  2014, Elsevier/Gold Standard. (2010-10-27 13:05:27)

## 2013-08-07 NOTE — Progress Notes (Signed)
   Patient presented to the office today with concerns about having found an ulcerated area between her vagina and her rectum. Patient states she's not been sexually active in over a year because of her husband. Patient does suffer from constipation. The patient was seen in the office for her annual exam on May 19 see previous note for details. She was placed on MiraLax has helped her to a certain extent but not completely. Patient denies any vaginal discharge. She states she has had her colonoscopy last year and does have history of colon polyps and she's on a five-year cycle. She denies any history of ulcerative colitis or Crohn's disease.  Exam: Bartholin, Urethra, Skene Glands: Within normal limits  Vagina: First degree cystocele second degree rectocele vaginal atrophy  Cervix: Absent  Uterus absent  2 small ulcerated areas were noted in the perineum body closer to the perirectal region. HSV culture was obtained. We're also going to check an RPR and HIV as well.  Assessment/plan: Patient with ulcerated lesion the area of the perineum HSV culture obtained. We'll draw an RPR and HIV as well. She will be prescribed clobetasol cream to apply each bedtime for 10-14 days. She will be prescribed 2% lidocaine to apply during the day when necessary. Her constipation she is going to be prescribed Linzess 145 mcg capsule to take 30 minutes before breakfast daily. She may take MiraLax when necessary but mostly at night not in the morning. She was encouraged to increase her fluid intake and her fiber intake. I've asked her to return to the office in 2 weeks for followup. If still present we'll need to perform a biopsy to rule out malignancy. Patient was too tender today.

## 2013-08-08 LAB — RPR

## 2013-08-08 LAB — HIV ANTIBODY (ROUTINE TESTING W REFLEX): HIV 1&2 Ab, 4th Generation: NONREACTIVE

## 2013-08-13 ENCOUNTER — Encounter: Payer: Self-pay | Admitting: Gynecology

## 2013-08-29 ENCOUNTER — Ambulatory Visit (INDEPENDENT_AMBULATORY_CARE_PROVIDER_SITE_OTHER): Payer: BC Managed Care – PPO | Admitting: Gynecology

## 2013-08-29 ENCOUNTER — Encounter: Payer: Self-pay | Admitting: Gynecology

## 2013-08-29 VITALS — BP 130/88

## 2013-08-29 DIAGNOSIS — N8111 Cystocele, midline: Secondary | ICD-10-CM

## 2013-08-29 DIAGNOSIS — L98499 Non-pressure chronic ulcer of skin of other sites with unspecified severity: Secondary | ICD-10-CM

## 2013-08-29 DIAGNOSIS — N816 Rectocele: Secondary | ICD-10-CM

## 2013-08-29 DIAGNOSIS — IMO0002 Reserved for concepts with insufficient information to code with codable children: Secondary | ICD-10-CM

## 2013-08-29 DIAGNOSIS — L98491 Non-pressure chronic ulcer of skin of other sites limited to breakdown of skin: Secondary | ICD-10-CM

## 2013-08-29 NOTE — Progress Notes (Signed)
   Patient presented to the office today for followup for her recent perianal ulceration that was treated with topical steroids. She stated has completely healed. We have done a complete STD workup recently which was negative to include HIV and RPR. The patient for numerous years has suffered from constipation and IBS. She now states that her symptoms of completely evacuating has become progressively worse and sometimes she feels she may have to digitalize herself to empty completely. She had been started on Linzess recently took it for 3 days and he gave her diarrhea so she discontinued. I also prescribed her MiraLax to take one tablespoon daily but she has stopped.  She was examined in the supine and erect position. 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  Patient with first-degree cystocele and second-degree rectocele. Patient not sexually active. Patient is using vaginal estrogen twice a week. Patient did have good anal week.  Assessment/plan: Patient will be referred to colorectal surgeon for endoscopic ultrasound and EMG for further evaluation of her perianal muscle weakness that is contributing to her not able to evacuate completely instead of just proceeding with a rectocele repair at this time. We will wait for her recommendation.

## 2013-09-03 ENCOUNTER — Telehealth: Payer: Self-pay | Admitting: *Deleted

## 2013-09-03 DIAGNOSIS — L98491 Non-pressure chronic ulcer of skin of other sites limited to breakdown of skin: Secondary | ICD-10-CM

## 2013-09-03 NOTE — Telephone Encounter (Signed)
Message copied by Thamas Jaegers on Mon Sep 03, 2013  8:48 AM ------      Message from: [name]      Created: Wed Aug 29, 2013 12:16 PM       Anderson [place], please make an appointment with the colorectal surgeon in reference to this patient as follows:             Patient will be referred to colorectal surgeon for endoscopic ultrasound and EMG for further evaluation of her perianal muscle weakness that is contributing to her not able to evacuate completely  ------

## 2013-09-03 NOTE — Telephone Encounter (Signed)
Left message for Kathy [name] regarding referral she will scheduled and contact me with time and day.

## 2013-09-03 NOTE — Telephone Encounter (Signed)
Appointment on 09/26/13 @ 3:30 pm with Dr.Thomas pt informed.

## 2013-09-12 ENCOUNTER — Encounter (INDEPENDENT_AMBULATORY_CARE_PROVIDER_SITE_OTHER): Payer: Self-pay | Admitting: General Surgery

## 2013-09-12 ENCOUNTER — Ambulatory Visit (INDEPENDENT_AMBULATORY_CARE_PROVIDER_SITE_OTHER): Payer: BC Managed Care – PPO | Admitting: General Surgery

## 2013-09-12 VITALS — BP 114/80 | HR 64 | Temp 98.4°F | Resp 16 | Ht 61.0 in | Wt 154.4 lb

## 2013-09-12 DIAGNOSIS — N816 Rectocele: Secondary | ICD-10-CM

## 2013-09-12 NOTE — Patient Instructions (Signed)
Rectocele  What is a rectocele? A rectocele is a bulging of the front wall of the rectum into the back wall of the vagina. Rectoceles are usually due to thinning of the rectovaginal septum (the tissue between the rectum and vagina) and weakening of the pelvic floor muscles. This is a very common defect; however, most women do not have symptoms. There can also be other pelvic organs that bulge into the vagina, leading to similar symptoms as rectocele, including the bladder (i.e., cystocele) and the small intestines (i.e. enterocele). What can lead to developing a rectocele? There are many things that can lead to weakening of the pelvic floor, resulting in a rectocele. These factors include: vaginal deliveries, birthing trauma during vaginal delivery (e.g. forceps delivery, vacuum delivery, tearing with a vaginal delivery, episiotomy during vaginal delivery), history of constipation, history of straining with bowel movements, and history of gynecological (hysterectomy) or rectal surgeries.  What are the symptoms associated with a rectocele? Most people with a small rectocele do not have symptoms and it is often only discovered during routine physical examination. When the rectocele is large, it most commonly presents with a noticeable bulge into the vagina. Other rectal symptoms may include: difficulty with evacuation during a bowel movement, the need to press against the vagina and/or space between the rectum and the vagina in order to have a bowel movement, straining with bowel movements, constipation, the urge to have multiple bowel movements throughout the day, and rectal pain. Occasionally, the stool becomes stuck in the bulge of the rectum, which is why it is difficult to have a bowel movement. Vaginal symptoms can include: pain with sexual intercourse (dyspareunia), vaginal bleeding, and a sense of fullness in the vagina. How can a rectocele be diagnosed? A rectocele is usually found incidentally  during a physical examination by your doctor. The evaluation of its severity, and potential relation to constipation symptoms, is hard to assess with physical examination alone. Further testing for a rectocele may include the use of a special x-ray study known as defecography (contrast material instilled into the rectum as an enema, followed by live x-ray imaging during a bowel movement). This study is very specific and can evaluate a rectocele's size and ability to completely empty.  How can a rectocele be treated? Rectoceles are not treated merely for their presence, but should only be addressed when they are associated with significant symptoms that interfere with quality of life. Prior to any treatment, there should be a thorough evaluation by your doctor to assess whether all of the complaints can be attributed to the presence of a rectocele alone. There are both medical and surgical treatment options for rectoceles. The majority of symptoms associated with a rectocele can be resolved with medical management; however, treatment depends on the severity of symptoms. How can a rectocele be treated with medical management only? It is very important to have a good bowel regimen in order to avoid constipation and straining with bowel movements. A high fiber diet, consisting of 25-30 grams of fiber daily, will help with this goal. This may be achieved with a fiber supplement, high fiber cereal, or high fiber bars. In addition to augmenting fiber intake, increased water intake (typically 6-8 glasses daily) is also highly recommended. This will allow for softer stools that do not require significant straining with bowel movements, thereby reducing your risk for having a bulge associated with a rectocele. Other treatments may include pelvic floor exercises such as Kegel exercises (i.e. biofeedback), stool softeners, hormone  replacement therapy, and avoidance of straining with bowel movements. At times, it is also  helpful to apply pressure to the back of the vagina during bowel movements.  How can a rectocele be treated with surgical management? The surgical management of rectoceles should only be considered when symptoms continue despite the use of medical management and are significant enough that they interfere with activities of daily living. There are abdominal, rectal, and vaginal surgeries that can be performed for rectoceles. The choice of procedure depends on the size of the rectocele and its associated symptoms. Most surgeries aim to remove the extra tissue that makes up the rectocele and strengthening the wall between the rectum and vagina with surrounding tissue or use of a mesh (i.e. patch). Colorectal surgeons, as well as gynecologists, are trained in the diagnosis and treatment of this condition. The success rate of the surgery depends upon the specific symptoms and symptom duration. Some of the risks of surgical correction of the rectocele are bleeding, infection, pain during intercourse (dyspareunia), as well as a risk that the rectocele may recur or worsen. What is a colon and rectal surgeon? Colon and rectal surgeons are experts in the surgical and non-surgical treatment of diseases of the colon, rectum and anus. They have completed advanced surgical training in the treatment of these diseases as well as full general surgical training. Board-certified colon and rectal surgeons complete residencies in general surgery and colon and rectal surgery, and pass intensive examinations conducted by the American Board of Surgery and the American Board of Colon and Rectal Surgery. They are well-versed in the treatment of both benign and malignant diseases of the colon, rectum and anus and are able to perform routine screening examinations and surgically treat conditions if indicated to do so. author: Shann Medal, MD, FACS, FASCRS, on behalf of the [place]  2012 American Society of  Colon & Rectal Surgeons

## 2013-09-12 NOTE — Progress Notes (Signed)
Chief Complaint  Patient presents with  . Rectal Problems    new pt- eval rectocele    HISTORY: Kathy [name] is a 63 y.o. female who presents to the office with difficulty with defecation.  This had been occurring for several years.   Nothing makes the symptoms worse.   It is continuous in nature.  her bowel habits are regular and her bowel movements are soft.  her fiber intake is dietary.  her last colonoscopy was this year and normal.  She has had polyps in the past.  she has had 2 vaginal deliveries, one did have some tearing. She reports splinting and manual manipulation of her rectum to have a BM and leakage of solid stool after evacuation.    Past Medical History  Diagnosis Date  . Fibroid   . Cystocele   . Rectocele   . Perimenopausal vasomotor symptoms   . Osteopenia   . Elevated cholesterol       Past Surgical History  Procedure Laterality Date  . Total hip arthroplasty  2001    RIGHT  . Vaginal hysterectomy  1992  . Arm surgery  2005  . Breast surgery    . Augmentation mammaplasty      SALINE        Current Outpatient Prescriptions  Medication Sig Dispense Refill  . aspirin 81 MG tablet Take 81 mg by mouth daily.        Marland Kitchen b complex vitamins tablet Take 1 tablet by mouth daily.      . Calcium Carbonate-Vitamin D (CALCIUM + D PO) Take by mouth.        . cholecalciferol (VITAMIN D) 1000 UNITS tablet Take 500 Units by mouth daily.       . Coenzyme Q10 (COQ10 PO) Take by mouth.        . diazepam (VALIUM) 10 MG tablet Take 1 tablet (10 mg total) by mouth every 6 (six) hours as needed.  30 tablet  3  . Omega-3 Fatty Acids (FISH OIL PO) Take by mouth.        Marland Kitchen SIMVASTATIN PO Take by mouth. Takes one half tablet daily      . vitamin E (VITAMIN E) 1000 UNIT capsule Take 2,000 Units by mouth daily.         No current facility-administered medications for this visit.      Allergies  Allergen Reactions  . Codeine Nausea And Vomiting      Family History  Problem  Relation Age of Onset  . Hypertension Mother   . Heart disease Mother   . Heart disease Father   . Colon cancer Neg Hx   . Esophageal cancer Neg Hx   . Rectal cancer Neg Hx   . Stomach cancer Neg Hx     History   Social History  . Marital Status: Married    Spouse Name: N/A    Number of Children: N/A  . Years of Education: N/A   Social History Main Topics  . Smoking status: Former Smoker    Quit date: 05/12/2012  . Smokeless tobacco: Never Used  . Alcohol Use: 1.5 oz/week    3 drink(s) per week  . Drug Use: No  . Sexual Activity: No   Other Topics Concern  . Not on file   Social History Narrative  . No narrative on file      REVIEW OF SYSTEMS - PERTINENT POSITIVES ONLY: Review of Systems - General ROS: negative for - chills,  fever or weight loss Hematological and Lymphatic ROS: negative for - bleeding problems, blood clots or bruising Respiratory ROS: no cough, shortness of breath, or wheezing Cardiovascular ROS: no chest pain or dyspnea on exertion Gastrointestinal ROS: no abdominal pain, change in bowel habits, or black or bloody stools Genito-Urinary ROS: no dysuria, trouble voiding, or hematuria  EXAM: There were no vitals filed for this visit.  General appearance: alert and cooperative Resp: clear to auscultation bilaterally Cardio: regular rate and rhythm GI: normal findings: soft, non-tender   Procedure: Anoscopy Surgeon: Marcello Moores Diagnosis: rectocele  Assistant: Ocie Bob After the risks and benefits were explained, verbal consent was obtained for above procedure  Anesthesia: none Findings: Moderate rectocele, good rectal tone and squeeze, normal push.  Redundant rectal mucosa without obvious signs of prolapse.  Minimal hemorrhoid disease.      ASSESSMENT AND PLAN: Kathy [name] is a 63 y.o. F who appears to have pelvic floor laxity and rectocele.  Her sphincter appears moderately functional on exam.  I have recommended that she proceed with  repair of rectocele and to add a fiber supplement to help bulk up her stools.  I have discussed this with Dr Toney Rakes, and we have decided to do a joint operation.      Rosario Adie, MD Colon and Rectal Surgery / [facility]      Visit Diagnoses: No diagnosis found.  Primary Care Physician: [name], MD

## 2013-09-24 ENCOUNTER — Telehealth: Payer: Self-pay | Admitting: *Deleted

## 2013-09-24 NOTE — Telephone Encounter (Signed)
Pt called because she saw the surgeon you sent her to for her rectocele. She states her problem is not better. She stated that the surgeon was going to discuss with you the next step and get back to her. What is the next step? Pls advise. Thanks

## 2013-09-25 NOTE — Telephone Encounter (Signed)
Please schedule rectocele repair with Dr. Marcello Moores to assist in the month of September. I will need to see her for preop discussion in the next 2 weeks.

## 2013-09-26 ENCOUNTER — Ambulatory Visit (INDEPENDENT_AMBULATORY_CARE_PROVIDER_SITE_OTHER): Payer: BC Managed Care – PPO | Admitting: General Surgery

## 2013-09-26 NOTE — Telephone Encounter (Signed)
Pt informed will hear from surgery scheduler. KW CMA

## 2013-10-01 ENCOUNTER — Ambulatory Visit (INDEPENDENT_AMBULATORY_CARE_PROVIDER_SITE_OTHER): Payer: BC Managed Care – PPO | Admitting: General Surgery

## 2013-10-12 ENCOUNTER — Other Ambulatory Visit: Payer: Self-pay | Admitting: Gynecology

## 2013-10-12 ENCOUNTER — Telehealth: Payer: Self-pay

## 2013-10-12 MED ORDER — ESTRADIOL 0.1 MG/GM VA CREA: TOPICAL_CREAM | VAGINAL | Status: DC

## 2013-10-12 NOTE — Telephone Encounter (Signed)
I contacted patient to let her know that Dr. Moshe Salisbury recommended Estradiol 0.02% cream qod until surgery.  Patient states she cannot come to [place] to pick this Rx up and wants something that you can call in to her [employer] ( She lives in [place] and works in [employer] and is working 10 hour days right now. )

## 2013-10-12 NOTE — Telephone Encounter (Signed)
Call in Estrace vaginal cream to apply every other day until surgery. 1 tube refill x 1

## 2013-10-12 NOTE — Telephone Encounter (Signed)
Rx sent 

## 2013-10-22 ENCOUNTER — Encounter (HOSPITAL_COMMUNITY): Payer: Self-pay | Admitting: Pharmacist

## 2013-10-22 NOTE — Patient Instructions (Addendum)
   Your procedure is scheduled on:  Wednesday, Sept 9  Enter through the [employer] [facility] at: [place] up the phone at the desk and dial [phone] and inform us of your arrival.  Please call this number if you have any problems the morning of surgery: [phone]  Remember: Do not eat food after midnight: Tuesday Do not drink clear liquids after: 9 AM Wednesday, day of surgery Take these medicines the morning of surgery with a SIP OF WATER: valium if needed.  Do not wear jewelry, make-up, or FINGER nail polish No metal in your hair or on your body. Do not wear lotions, powders, perfumes.  You may wear deodorant.  Do not bring valuables to the hospital. Contacts, dentures or bridgework may not be worn into surgery.  For patients being admitted to the hospital, checkout time is 11:00am the day of discharge.    Patients discharged on the day of surgery will not be allowed to drive home.  Home with husband Bill cell [phone].

## 2013-10-23 ENCOUNTER — Ambulatory Visit (INDEPENDENT_AMBULATORY_CARE_PROVIDER_SITE_OTHER): Payer: BC Managed Care – PPO | Admitting: Gynecology

## 2013-10-23 ENCOUNTER — Encounter: Payer: Self-pay | Admitting: Gynecology

## 2013-10-23 ENCOUNTER — Encounter (HOSPITAL_COMMUNITY): Payer: Self-pay

## 2013-10-23 ENCOUNTER — Encounter (HOSPITAL_COMMUNITY)
Admission: RE | Admit: 2013-10-23 | Discharge: 2013-10-23 | Disposition: A | Discharge status: Home / Self Care | Payer: BC Managed Care – PPO | Source: Ambulatory Visit | Attending: Gynecology | Admitting: Gynecology

## 2013-10-23 VITALS — BP 130/90

## 2013-10-23 DIAGNOSIS — N816 Rectocele: Secondary | ICD-10-CM | POA: Diagnosis present

## 2013-10-23 DIAGNOSIS — Z01818 Encounter for other preprocedural examination: Secondary | ICD-10-CM | POA: Insufficient documentation

## 2013-10-23 DIAGNOSIS — N898 Other specified noninflammatory disorders of vagina: Secondary | ICD-10-CM

## 2013-10-23 HISTORY — DX: Calculus of kidney: N20.0

## 2013-10-23 LAB — BASIC METABOLIC PANEL
ANION GAP: 11 (ref 5–15)
BUN: 13 mg/dL (ref 6–23)
CO2: 26 meq/L (ref 19–32)
Calcium: 11.1 mg/dL — ABNORMAL HIGH (ref 8.4–10.5)
Chloride: 102 mEq/L (ref 96–112)
Creatinine, Ser: 0.52 mg/dL (ref 0.50–1.10)
GLUCOSE: 109 mg/dL — AB (ref 70–99)
Potassium: 3.9 mEq/L (ref 3.7–5.3)
Sodium: 139 mEq/L (ref 137–147)

## 2013-10-23 LAB — CBC
HCT: 39 % (ref 36.0–46.0)
Hemoglobin: 13.4 g/dL (ref 12.0–15.0)
MCH: 31.8 pg (ref 26.0–34.0)
MCHC: 34.4 g/dL (ref 30.0–36.0)
MCV: 92.6 fL (ref 78.0–100.0)
PLATELETS: 238 10*3/uL (ref 150–400)
RBC: 4.21 MIL/uL (ref 3.87–5.11)
RDW: 13.6 % (ref 11.5–15.5)
WBC: 6.4 10*3/uL (ref 4.0–10.5)

## 2013-10-23 LAB — URINALYSIS, ROUTINE W REFLEX MICROSCOPIC
Bilirubin Urine: NEGATIVE
Glucose, UA: NEGATIVE mg/dL
Ketones, ur: NEGATIVE mg/dL
LEUKOCYTES UA: NEGATIVE
NITRITE: NEGATIVE
PROTEIN: NEGATIVE mg/dL
Specific Gravity, Urine: 1.025 (ref 1.005–1.030)
Urobilinogen, UA: 0.2 mg/dL (ref 0.0–1.0)
pH: 6 (ref 5.0–8.0)

## 2013-10-23 LAB — WET PREP FOR TRICH, YEAST, CLUE
Clue Cells Wet Prep HPF POC: NONE SEEN
Trich, Wet Prep: NONE SEEN
Yeast Wet Prep HPF POC: NONE SEEN

## 2013-10-23 LAB — URINE MICROSCOPIC-ADD ON

## 2013-10-23 MED ORDER — METOCLOPRAMIDE HCL 10 MG PO TABS: 10.0000 mg | ORAL_TABLET | Freq: Three times a day (TID) | ORAL | Status: DC

## 2013-10-23 MED ORDER — IBUPROFEN 800 MG PO TABS: 800.0000 mg | ORAL_TABLET | Freq: Three times a day (TID) | ORAL | Status: DC | PRN

## 2013-10-23 MED ORDER — OXYCODONE-ACETAMINOPHEN 5-325 MG PO TABS: 1.0000 | ORAL_TABLET | Freq: Four times a day (QID) | ORAL | Status: DC | PRN

## 2013-10-23 MED ORDER — DOCUSATE SODIUM 100 MG PO CAPS: ORAL_CAPSULE | ORAL | Status: DC

## 2013-10-23 NOTE — Progress Notes (Signed)
Kathy [name] is an 63 y.o. female. For preoperative exam and consultation for her scheduled rectocele repair next week as a result of her symptomatic rectocele. Patient has complained of difficulty with defecation and has been occurring for several years. She takes Metamucil every morning. She reports her bowel habits are regular and soft but nevertheless she has to self digitalize herself at times to completely evacuate. She had a normal colonoscopy earlier this year but has had history of colon polyps in the past. Years ago she had a vaginal hysterectomy with ovarian conservation. She had 2 prior vaginal deliveries as well. She had been referred to the colorectal surgeon Dr. Leighton Ruff who agreed with my findings of a moderate rectocele and she reports that the patient has good rectal tone and squeeze and normal push. She will be scheduled for a rectocele repair for this redundant rectal mucosa. Patient with no vaginal prolapse.  Pertinent Gynecological History: Menses: post-menopausal Bleeding: NONE Contraception: post menopausal status DES exposure: unknown Blood transfusions: none Sexually transmitted diseases: no past history Previous GYN Procedures: TVH,BTL  Last mammogram: normal Date: 2015 Last pap: normal Date: 2012 OB History: G3, P2   Menstrual History: Menarche age: 13  No LMP recorded. Patient has had a hysterectomy.    Past Medical History  Diagnosis Date  . Fibroid   . Cystocele   . Rectocele   . Perimenopausal vasomotor symptoms   . Osteopenia   . Elevated cholesterol   . SVD (spontaneous vaginal delivery)     x 2  . Kidney stone     passed stone    Past Surgical History  Procedure Laterality Date  . Total hip arthroplasty  2001    RIGHT  . Vaginal hysterectomy  1992  . Arm surgery  2005  . Breast surgery      augmentation in 20's  . Augmentation mammaplasty      SALINE in 20's  . Tonsillectomy    . Wisdom tooth extraction    . Tubal ligation     . Colonoscopy    . Right arm surgery      tendon repair    Family History  Problem Relation Age of Onset  . Hypertension Mother   . Heart disease Mother   . Heart disease Father   . Colon cancer Neg Hx   . Esophageal cancer Neg Hx   . Rectal cancer Neg Hx   . Stomach cancer Neg Hx     Social History:  reports that she has been smoking Cigarettes.  She has been smoking about 0.10 packs per day. She has never used smokeless tobacco. She reports that she drinks about 1.2 ounces of alcohol per week. She reports that she does not use illicit drugs.  Allergies:  Allergies  Allergen Reactions  . Codeine Nausea And Vomiting     (Not in a hospital admission)  REVIEW OF SYSTEMS: A ROS was performed and pertinent positives and negatives are included in the history.  GENERAL: No fevers or chills. HEENT: No change in vision, no earache, sore throat or sinus congestion. NECK: No pain or stiffness. CARDIOVASCULAR: No chest pain or pressure. No palpitations. PULMONARY: No shortness of breath, cough or wheeze. GASTROINTESTINAL: No abdominal pain, nausea, vomiting or diarrhea, melena or bright red blood per rectum. GENITOURINARY: No urinary frequency, urgency, hesitancy or dysuria. MUSCULOSKELETAL: No joint or muscle pain, no back pain, no recent trauma. DERMATOLOGIC: No rash, no itching, no lesions. ENDOCRINE: No polyuria, polydipsia, no  heat or cold intolerance. No recent change in weight. HEMATOLOGICAL: No anemia or easy bruising or bleeding. NEUROLOGIC: No headache, seizures, numbness, tingling or weakness. PSYCHIATRIC: No depression, no loss of interest in normal activity or change in sleep pattern.     Blood pressure 130/90.  Physical Exam:  HEENT:unremarkable Neck:Supple, midline, no thyroid megaly, no carotid bruits Lungs:  Clear to auscultation no rhonchi's or wheezes Heart:Regular rate and rhythm, no murmurs or gallops Breast Exam: Both breasts are examined sitting supine position  and palpable masses or tenderness no supraclavicular axillary lymphadenopathy. Evidence of prior silicone implants Abdomen: Soft nontender no rebound or guarding large transverse abdominal scar from previous abdominoplasty Pelvic:BUS within normal limits Vagina: Large rectocele Cervix: Absent Uterus: Absent Adnexa: No palpable mass or tenderness Extremities: No cords, no edema Rectal: rectocele no enterocele when examined in the supine position  Results for orders placed during the hospital encounter of 10/23/13 (from the past 24 hour(s))  URINALYSIS, ROUTINE W REFLEX MICROSCOPIC     Status: Abnormal   Collection Time    10/23/13  2:50 PM      Result Value Ref Range   Color, Urine YELLOW  YELLOW   APPearance CLEAR  CLEAR   Specific Gravity, Urine 1.025  1.005 - 1.030   pH 6.0  5.0 - 8.0   Glucose, UA NEGATIVE  NEGATIVE mg/dL   Hgb urine dipstick SMALL (*) NEGATIVE   Bilirubin Urine NEGATIVE  NEGATIVE   Ketones, ur NEGATIVE  NEGATIVE mg/dL   Protein, ur NEGATIVE  NEGATIVE mg/dL   Urobilinogen, UA 0.2  0.0 - 1.0 mg/dL   Nitrite NEGATIVE  NEGATIVE   Leukocytes, UA NEGATIVE  NEGATIVE  URINE MICROSCOPIC-ADD ON     Status: Abnormal   Collection Time    10/23/13  2:50 PM      Result Value Ref Range   Squamous Epithelial / LPF MANY (*) RARE   WBC, UA 3-6  <3 WBC/hpf   RBC / HPF 3-6  <3 RBC/hpf   Bacteria, UA MANY (*) RARE  BASIC METABOLIC PANEL     Status: Abnormal   Collection Time    10/23/13  2:51 PM      Result Value Ref Range   Sodium 139  137 - 147 mEq/L   Potassium 3.9  3.7 - 5.3 mEq/L   Chloride 102  96 - 112 mEq/L   CO2 26  19 - 32 mEq/L   Glucose, Bld 109 (*) 70 - 99 mg/dL   BUN 13  6 - 23 mg/dL   Creatinine, Ser 0.52  0.50 - 1.10 mg/dL   Calcium 11.1 (*) 8.4 - 10.5 mg/dL   GFR calc non Af Amer >90  >90 mL/min   GFR calc Af Amer >90  >90 mL/min   Anion gap 11  5 - 15  CBC     Status: None   Collection Time    10/23/13  2:51 PM      Result Value Ref Range   WBC  6.4  4.0 - 10.5 K/uL   RBC 4.21  3.87 - 5.11 MIL/uL   Hemoglobin 13.4  12.0 - 15.0 g/dL   HCT 39.0  36.0 - 46.0 %   MCV 92.6  78.0 - 100.0 fL   MCH 31.8  26.0 - 34.0 pg   MCHC 34.4  30.0 - 36.0 g/dL   RDW 13.6  11.5 - 15.5 %   Platelets 238  150 - 400 K/uL    No results  found.  Assessment/Plan: Patient with long-standing symptomatic rectocele with good sphincter tone. She will be scheduled for a rectocele repair. I have asked her to stop her baby aspirin 3 days before her period the patient will continue on her vaginal estrogen every other day until surgery. Prescription for Percocet as well as Reglan and Motrin 800 mg was made available to patient. The risk of the procedure was follows:                        Patient was counseled as to the risk of surgery to include the following:  1. Infection (prohylactic antibiotics will be administered)  2. DVT/Pulmonary Embolism (prophylactic pneumo compression stockings will be used)  3.Trauma to internal organs requiring additional surgical procedure to repair any injury to     Internal organs requiring perhaps additional hospitalization days.  4.Hemmorhage requiring transfusion and blood products which carry risks such as  anaphylactic reaction, hepatitis and AIDS  5. Injury to rectum and/or sphincter requiring repair.  6. Vaginal narrowing which could cause pain and dyspareunia  7. Repair breakdown in the future requiring additional operations  8. Risk of vaginal rectal fistula  Patient had received literature information on the procedure scheduled and all her questions were answered and fully accepts all risk.   [facility] HMD4:57 PMTD@Note : This dictation was prepared with  Dragon/digital dictation along withSmart phrase technology. Any transcriptional errors that result from this process are unintentional.       [name] 10/23/2013, 4:24 PM  Note: This dictation was prepared with  Dragon/digital dictation along  withSmart phrase technology. Any transcriptional errors that result from this process are unintentional.

## 2013-10-23 NOTE — Patient Instructions (Signed)
Rectocele/Enterocele, Care After A woman's birth canal (vagina) can become weak or stretched. This can be caused by childbirth, heavy lifting, lasting (chronic) constipation, aging, or pelvic surgery. When the vagina is weak and stretched, parts of the intestine can bulge into the vagina by pushing against the vaginal walls. A rectocele is when the very end of the large intestine (rectum) causes the bulge. An enterocele is when the small intestine causes the bulge. Surgery to fix this problem is usually done through the vagina. If you just had this surgery, you were probably given a drug to make you sleep (general anesthetic) or a drug that numbs you from the waist down (spinal/epidural). Here is what happened:  The small intestine or rectum was pushed back to its normal place.  The vaginal wall was made stronger. Sometimes this is done with stitches or a mesh-like material. HOME CARE INSTRUCTIONS  Some women go home the same day as their surgery. Others stay in the hospital for a few days. This depends on the size and type of repair.  Pain and Medications  Some pain is normal after this surgery. Only take pain medicine your surgeon prescribed. Follow the directions carefully.  Do not take aspirin. It can cause bleeding.  Do not drink alcohol while taking pain medication.  You may be given a medicine (antibiotic) that kills germs. Follow the directions carefully.  Take warm sitz baths 2 times a day to control discomfort and reduce any swelling. Take sitz baths with your caregiver's permission. Diet  Go back to your normal eating as directed by your caregiver.  Drink a lot of fluids. Drink at least 6 glasses of water every day. Activity  Move around and walk as much as possible. This can keep blood clots from forming in your legs.  Do not climb stairs until your caregiver says it is okay.  Do not lift objects 5 pounds (2.3 kg) or heavier. Do not bend or strain for 6 to 8 weeks.  Do  not drive until after you stop taking pain medicine and your caregiver says it is okay.  Your return to work will depend on the type of work you do. Ask your caregiver what is best for you.  Ask your caregiver when you can resume sexual activity. Most women can start having sex in about 6 weeks after their surgery.  Get plenty of rest during the day and sleep at night.  Have someone help you with your household chores and activities for 3 to 4 weeks. Other Precautions  You may have some discharge from the vagina for a few weeks after the surgery. It may have small amounts of blood in it. This is normal. If you have questions, ask your caregiver.  Do not use tampons or douche.  You should be able to take a shower a day after your surgery. Do not take a tub bath for at least a week.  Take it easy for awhile. You should feel much better in 2 to 3 weeks. It may take up to 6 weeks to feel completely normal.  Keep all follow-up appointments.  Take your temperature twice a day and write it down.  Make sure your family understands everything about your surgery and recovery. SEEK MEDICAL CARE IF:   You have any questions about your medication, or you need stronger pain medication.  Pain continues, even after taking pain medication.  You become constipated.  You have an oral temperature above 102 F (38.9 C).    You develop swelling and redness in the surgery area.  You become dizzy or lightheaded.  You feel sick to your stomach (nauseous), throw up (vomit), or have diarrhea.  You develop a rash.  You have a reaction to your medications. SEEK IMMEDIATE MEDICAL CARE IF:   Pain gets worse.  You have new bleeding from your vagina.  Discharge from the vagina becomes heavy, or it has a bad smell.  You have an oral temperature above 102 F (38.9 C), not controlled by medicine.  You develop belly (abdominal) pain.  You develop chest pain.  You develop shortness of  breath.  You pass out (faint).  You develop pain, swelling, or redness in the leg.  You have pain or burning with urination.  You have bloody urine or cannot urinate. MAKE SURE YOU:   Understand these instructions.  Will watch your condition.  Will get help right away if you are not doing well or get worse. Document Released: 05/05/2009 Document Revised: 11/29/2012 Document Reviewed: 05/05/2009 Exit[employer] Patient Information 2015 [employer]. This information is not intended to replace advice given to you by your health care provider. Make sure you discuss any questions you have with your health care provider.  

## 2013-10-24 ENCOUNTER — Telehealth: Payer: Self-pay

## 2013-10-24 NOTE — Telephone Encounter (Signed)
Patient advised.

## 2013-10-24 NOTE — Telephone Encounter (Signed)
Prescriptions given to yesterday was for her to purchase and to have for her postop

## 2013-10-24 NOTE — Telephone Encounter (Signed)
Patient said you gave her several Rx's at her pre op visit yesterday.  She is not clear on when she is supposed to start the Reglan prescription that you gave her.

## 2013-10-30 MED ORDER — CEFOTETAN DISODIUM 2 G IJ SOLR
2.0000 g | INTRAMUSCULAR | Status: AC
  Administered 2013-10-31: 2 g via INTRAVENOUS
  Filled 2013-10-30: qty 2

## 2013-10-31 ENCOUNTER — Ambulatory Visit (HOSPITAL_COMMUNITY): Payer: BC Managed Care – PPO | Admitting: Anesthesiology

## 2013-10-31 ENCOUNTER — Encounter (HOSPITAL_COMMUNITY): Payer: Self-pay | Admitting: *Deleted

## 2013-10-31 ENCOUNTER — Encounter (HOSPITAL_COMMUNITY): Payer: BC Managed Care – PPO | Admitting: Anesthesiology

## 2013-10-31 ENCOUNTER — Other Ambulatory Visit: Payer: Self-pay | Admitting: Gynecology

## 2013-10-31 ENCOUNTER — Encounter (HOSPITAL_COMMUNITY)
Admission: RE | Disposition: A | Discharge status: Home / Self Care | Payer: Self-pay | Source: Ambulatory Visit | Attending: Gynecology

## 2013-10-31 ENCOUNTER — Telehealth: Payer: Self-pay

## 2013-10-31 ENCOUNTER — Ambulatory Visit (HOSPITAL_COMMUNITY)
Admission: RE | Admit: 2013-10-31 | Discharge: 2013-11-01 | Disposition: A | Discharge status: Home / Self Care | Payer: BC Managed Care – PPO | Source: Ambulatory Visit | Attending: Gynecology | Admitting: Gynecology

## 2013-10-31 DIAGNOSIS — Z9889 Other specified postprocedural states: Secondary | ICD-10-CM

## 2013-10-31 DIAGNOSIS — F17200 Nicotine dependence, unspecified, uncomplicated: Secondary | ICD-10-CM | POA: Insufficient documentation

## 2013-10-31 DIAGNOSIS — Z860100 Personal history of colon polyps, unspecified: Secondary | ICD-10-CM | POA: Insufficient documentation

## 2013-10-31 DIAGNOSIS — Z0289 Encounter for other administrative examinations: Secondary | ICD-10-CM

## 2013-10-31 DIAGNOSIS — N816 Rectocele: Secondary | ICD-10-CM | POA: Insufficient documentation

## 2013-10-31 HISTORY — PX: RECTOCELE REPAIR: SHX761

## 2013-10-31 SURGERY — COLPORRHAPHY, POSTERIOR, FOR RECTOCELE REPAIR
Anesthesia: General

## 2013-10-31 MED ORDER — KETOROLAC TROMETHAMINE 30 MG/ML IJ SOLN: 15.0000 mg | Freq: Once | INTRAMUSCULAR | Status: DC | PRN

## 2013-10-31 MED ORDER — 0.9 % SODIUM CHLORIDE (POUR BTL) OPTIME
TOPICAL | Status: DC | PRN
  Administered 2013-10-31: 1000 mL

## 2013-10-31 MED ORDER — LACTATED RINGERS IV SOLN
INTRAVENOUS | Status: DC
  Administered 2013-10-31 – 2013-11-01 (×2): via INTRAVENOUS

## 2013-10-31 MED ORDER — METOCLOPRAMIDE HCL 10 MG PO TABS
10.0000 mg | ORAL_TABLET | Freq: Three times a day (TID) | ORAL | Status: DC
  Administered 2013-11-01: 10 mg via ORAL
  Filled 2013-10-31: qty 1

## 2013-10-31 MED ORDER — IBUPROFEN 800 MG PO TABS
800.0000 mg | ORAL_TABLET | Freq: Four times a day (QID) | ORAL | Status: DC | PRN
  Administered 2013-10-31 – 2013-11-01 (×2): 800 mg via ORAL
  Filled 2013-10-31 (×2): qty 1

## 2013-10-31 MED ORDER — GLYCOPYRROLATE 0.2 MG/ML IJ SOLN
INTRAMUSCULAR | Status: DC | PRN
  Administered 2013-10-31: .2 mg via INTRAVENOUS

## 2013-10-31 MED ORDER — PROPOFOL 10 MG/ML IV EMUL
INTRAVENOUS | Status: AC
  Filled 2013-10-31: qty 20

## 2013-10-31 MED ORDER — LACTATED RINGERS IV SOLN
INTRAVENOUS | Status: DC
  Administered 2013-10-31 (×4): via INTRAVENOUS

## 2013-10-31 MED ORDER — LIDOCAINE-EPINEPHRINE 1 %-1:100000 IJ SOLN
INTRAMUSCULAR | Status: AC
  Filled 2013-10-31: qty 1

## 2013-10-31 MED ORDER — DOCUSATE SODIUM 100 MG PO CAPS
100.0000 mg | ORAL_CAPSULE | Freq: Every day | ORAL | Status: DC
  Administered 2013-10-31: 100 mg via ORAL
  Filled 2013-10-31: qty 1

## 2013-10-31 MED ORDER — ATROPINE SULFATE 0.4 MG/ML IJ SOLN
INTRAMUSCULAR | Status: DC | PRN
  Administered 2013-10-31: .1 mg via INTRAVENOUS

## 2013-10-31 MED ORDER — OXYCODONE-ACETAMINOPHEN 5-325 MG PO TABS
1.0000 | ORAL_TABLET | Freq: Four times a day (QID) | ORAL | Status: DC | PRN
  Administered 2013-10-31 – 2013-11-01 (×3): 1 via ORAL
  Filled 2013-10-31 (×3): qty 1

## 2013-10-31 MED ORDER — SCOPOLAMINE 1 MG/3DAYS TD PT72
MEDICATED_PATCH | TRANSDERMAL | Status: AC
  Administered 2013-10-31: 1.5 mg via TRANSDERMAL
  Filled 2013-10-31: qty 1

## 2013-10-31 MED ORDER — KETOROLAC TROMETHAMINE 30 MG/ML IJ SOLN
INTRAMUSCULAR | Status: DC | PRN
  Administered 2013-10-31: 30 mg via INTRAVENOUS

## 2013-10-31 MED ORDER — MIDAZOLAM HCL 2 MG/2ML IJ SOLN
INTRAMUSCULAR | Status: AC
  Filled 2013-10-31: qty 2

## 2013-10-31 MED ORDER — LIDOCAINE HCL (CARDIAC) 20 MG/ML IV SOLN
INTRAVENOUS | Status: AC
  Filled 2013-10-31: qty 5

## 2013-10-31 MED ORDER — SCOPOLAMINE 1 MG/3DAYS TD PT72
1.0000 | MEDICATED_PATCH | Freq: Once | TRANSDERMAL | Status: DC
Start: 2013-10-31 — End: 2013-10-31
  Administered 2013-10-31: 1.5 mg via TRANSDERMAL

## 2013-10-31 MED ORDER — PROMETHAZINE HCL 25 MG/ML IJ SOLN: 6.2500 mg | INTRAMUSCULAR | Status: DC | PRN

## 2013-10-31 MED ORDER — FENTANYL CITRATE 0.05 MG/ML IJ SOLN
INTRAMUSCULAR | Status: AC
  Filled 2013-10-31: qty 2

## 2013-10-31 MED ORDER — DEXAMETHASONE SODIUM PHOSPHATE 10 MG/ML IJ SOLN
INTRAMUSCULAR | Status: AC
  Filled 2013-10-31: qty 1

## 2013-10-31 MED ORDER — ATROPINE SULFATE 0.4 MG/ML IJ SOLN
INTRAMUSCULAR | Status: AC
  Filled 2013-10-31: qty 1

## 2013-10-31 MED ORDER — MIDAZOLAM HCL 2 MG/2ML IJ SOLN: 0.5000 mg | Freq: Once | INTRAMUSCULAR | Status: DC | PRN

## 2013-10-31 MED ORDER — PROPOFOL 10 MG/ML IV BOLUS
INTRAVENOUS | Status: DC | PRN
Start: 2013-10-31 — End: 2013-10-31
  Administered 2013-10-31: 20 mg via INTRAVENOUS
  Administered 2013-10-31: 300 mg via INTRAVENOUS
  Administered 2013-10-31: 20 mg via INTRAVENOUS

## 2013-10-31 MED ORDER — ESTRADIOL 0.1 MG/GM VA CREA
TOPICAL_CREAM | VAGINAL | Status: AC
  Filled 2013-10-31: qty 42.5

## 2013-10-31 MED ORDER — LIDOCAINE HCL (CARDIAC) 20 MG/ML IV SOLN
INTRAVENOUS | Status: DC | PRN
  Administered 2013-10-31 (×2): 50 mg via INTRAVENOUS

## 2013-10-31 MED ORDER — FENTANYL CITRATE 0.05 MG/ML IJ SOLN: 25.0000 ug | INTRAMUSCULAR | Status: DC | PRN

## 2013-10-31 MED ORDER — FENTANYL CITRATE 0.05 MG/ML IJ SOLN
INTRAMUSCULAR | Status: DC | PRN
  Administered 2013-10-31 (×3): 25 ug via INTRAVENOUS
  Administered 2013-10-31: 100 ug via INTRAVENOUS
  Administered 2013-10-31: 25 ug via INTRAVENOUS

## 2013-10-31 MED ORDER — HYDROMORPHONE HCL PF 1 MG/ML IJ SOLN
INTRAMUSCULAR | Status: AC
  Filled 2013-10-31: qty 1

## 2013-10-31 MED ORDER — ESTRADIOL 0.1 MG/GM VA CREA
TOPICAL_CREAM | VAGINAL | Status: DC | PRN
  Administered 2013-10-31: 1 via VAGINAL

## 2013-10-31 MED ORDER — DEXAMETHASONE SODIUM PHOSPHATE 4 MG/ML IJ SOLN
INTRAMUSCULAR | Status: DC | PRN
  Administered 2013-10-31: 10 mg via INTRAVENOUS

## 2013-10-31 MED ORDER — METHYLENE BLUE 1 % INJ SOLN
INTRAMUSCULAR | Status: AC
  Filled 2013-10-31: qty 1

## 2013-10-31 MED ORDER — LIDOCAINE-EPINEPHRINE 1 %-1:100000 IJ SOLN
INTRAMUSCULAR | Status: DC | PRN
  Administered 2013-10-31: 10 mL

## 2013-10-31 MED ORDER — GLYCOPYRROLATE 0.2 MG/ML IJ SOLN
INTRAMUSCULAR | Status: AC
  Filled 2013-10-31: qty 1

## 2013-10-31 MED ORDER — INFLUENZA VAC SPLIT QUAD 0.5 ML IM SUSY
0.5000 mL | PREFILLED_SYRINGE | INTRAMUSCULAR | Status: AC
  Administered 2013-11-01: 0.5 mL via INTRAMUSCULAR
  Filled 2013-10-31: qty 0.5

## 2013-10-31 MED ORDER — KETOROLAC TROMETHAMINE 30 MG/ML IJ SOLN
INTRAMUSCULAR | Status: AC
  Filled 2013-10-31: qty 1

## 2013-10-31 MED ORDER — MEPERIDINE HCL 25 MG/ML IJ SOLN: 6.2500 mg | INTRAMUSCULAR | Status: DC | PRN

## 2013-10-31 MED ORDER — ONDANSETRON HCL 4 MG/2ML IJ SOLN
INTRAMUSCULAR | Status: AC
  Filled 2013-10-31: qty 2

## 2013-10-31 MED ORDER — ONDANSETRON HCL 4 MG/2ML IJ SOLN
INTRAMUSCULAR | Status: DC | PRN
  Administered 2013-10-31: 4 mg via INTRAVENOUS

## 2013-10-31 SURGICAL SUPPLY — 25 items
CLOTH BEACON ORANGE TIMEOUT ST (SAFETY) ×2 IMPLANT
CONT PATH 16OZ SNAP LID 3702 (MISCELLANEOUS) IMPLANT
DECANTER SPIKE VIAL GLASS SM (MISCELLANEOUS) IMPLANT
FILTER STRAW FLUID ASPIR (MISCELLANEOUS) IMPLANT
GAUZE PACKING 2X5 YD STRL (GAUZE/BANDAGES/DRESSINGS) ×1 IMPLANT
GLOVE BIOGEL PI IND STRL 8 (GLOVE) ×1 IMPLANT
GLOVE BIOGEL PI INDICATOR 8 (GLOVE) ×1
GLOVE ECLIPSE 7.5 STRL STRAW (GLOVE) ×4 IMPLANT
GOWN STRL REUS W/TWL LRG LVL3 (GOWN DISPOSABLE) ×8 IMPLANT
NEEDLE HYPO 22GX1.5 SAFETY (NEEDLE) IMPLANT
NEEDLE SPNL 22GX3.5 QUINCKE BK (NEEDLE) IMPLANT
NS IRRIG 1000ML POUR BTL (IV SOLUTION) ×2 IMPLANT
PACK VAGINAL WOMENS (CUSTOM PROCEDURE TRAY) ×2 IMPLANT
SUT VIC AB 1 CT1 27 (SUTURE)
SUT VIC AB 1 CT1 27XBRD ANTBC (SUTURE) IMPLANT
SUT VIC AB 2-0 CT2 27 (SUTURE) ×4 IMPLANT
SUT VIC AB 2-0 SH 27 (SUTURE) ×8
SUT VIC AB 2-0 SH 27XBRD (SUTURE) ×4 IMPLANT
SUT VIC AB 3-0 SH 27 (SUTURE)
SUT VIC AB 3-0 SH 27X BRD (SUTURE) IMPLANT
SUT VICRYL 3 0 BR 18  UND (SUTURE)
SUT VICRYL 3 0 BR 18 UND (SUTURE) IMPLANT
TOWEL OR 17X24 6PK STRL BLUE (TOWEL DISPOSABLE) ×4 IMPLANT
TRAY FOLEY CATH 14FR (SET/KITS/TRAYS/PACK) ×2 IMPLANT
WATER STERILE IRR 1000ML POUR (IV SOLUTION) ×2 IMPLANT

## 2013-10-31 NOTE — Op Note (Signed)
Operative Note  10/31/2013  4:18 PM  PATIENT:  Kathy [name]  63 y.o. female  PRE-OPERATIVE DIAGNOSIS:  rectocele  POST-OPERATIVE DIAGNOSIS:  rectocele  PROCEDURE:  Procedure(s): POSTERIOR REPAIR (RECTOCELE)  SURGEON:  Juan H. Toney Rakes MD First Asst. Leighton Ruff MD  ANESTHESIA:   general  FINDINGS: Rectocele  DESCRIPTION OF OPERATION: The patient was taken to the operating room where she underwent successful general endotracheal anesthesia. A time out was undertaken to properly identified the patient and to voice out lower procedure to be undertaken. Patient received 2 g of Cefotan IV for prophylaxis and had PAS stockings for DVT prophylaxis. She was placed in the high lithotomy position. The vagina and perineum were prepped and draped in the usual sterile fashion. A red rubber Robinson catheter was inserted into the bladder its contents for a total of 75 cc. An incision was made at the lateral mucocutaneous border of the introitus in a V-shaped pattern. The dissected perineal skin was excised horizontally along the vaginal outlet. A midline vertical incision was initiated at the center of the mucosal H. by tunneling beneath the posterior mucosa with Metzenbaum scissors, incising the free mucosa in the midline and extended dissection above the rectocele. Traction was placed on the upper margin of each segment of the mucosal dissection using an Allis clamp. The lateral margins of the incision were held under tension with additional clamps were mucosa was separated with Metzenbaum scissors from the underlying fascia and muscle attachments by sharp dissection. The lateral dissection was extended as far lateral as possible to mobilize the perirectal fascia and to expose the medial margins of the levator ani muscle. The terminal ends of the bulbocavernous and transverse perineal muscles were freed from the adjacent mucosa in the lower vagina. The perirectal fascia was then drawn over the  bulging rectocele and by using several vertical mattress sutures of 0 Vicryl suture was used to reinforce and completely close the hernial defect over the rectal wall. Following this interrupted sutures of 0 Vicryl were utilized and were placed between the medial margins of the levator ani muscles approximating the muscles and fascia sufficiently over the lower rectal wall to establish support to the perineal body and the posterior vaginal wall. Excess vaginal mucosa was then excised and the margins were closed beginning at the apex of the vagina using 20 delayed absorbable Vicryl suture and incorporating the underlying fascia with a running stitch to obliterate that space between the vaginal wall and the rectum. Once it continues mucosal sutures reached the levator muscle sutures the deep interrupted sutures were then tied approximating the edges. 1% lidocaine with 1 100,000 epinephrine was then infiltrated into the vaginal mucosa and incision site for postop analgesia for a total of 10 cc. A Kerlix bandage impregnated with estrogen was placed intravaginally and her temp at 9 support postoperatively. This will be removed 24 hours later. A Foley catheter was inserted to monitor urinary output postoperatively. The patient was extubated and transferred to recovery with stable vital signs. Blood loss was less than 50 cc. Patient received 30 mg IV in route to the recovery room. Sponge and needle count were correct.  ESTIMATED BLOOD LOSS: Less than 50 cc   Intake/Output Summary (Last 24 hours) at 10/31/13 1618 Last data filed at 10/31/13 1509  Gross per 24 hour  Intake   1400 ml  Output    150 ml  Net   1250 ml     BLOOD ADMINISTERED:none   LOCAL MEDICATIONS  USED:  XYLOCAINE  1 % with 1N 100,000 epinephrine 10 cc local  SPECIMEN:  Source of Specimen:  Vaginal mucosa  DISPOSITION OF SPECIMEN:  N/A  COUNTS:  YES  PLAN OF CARE: Transfer to PACU  [facility] HMD4:18 PMTD@  Note: This dictation  was prepared with  Dragon/digital dictation along withSmart phrase technology. Any transcriptional errors that result from this process are unintentional.

## 2013-10-31 NOTE — Telephone Encounter (Signed)
Questions regarding Rx's you wrote today.  #1 You wrote for estradiol vaginal cream 0.02% and the directions are s: apply 3 x daily.  They wanted to confirm that you wanted her to actually use it 3 x daily.  #2 You wrote "vaginal estradiol lidocaine gel 2%" and that one also to apply tid with 2 refills.  They were questioning did you really just want plain commercially available lidocaine gel or is this something you were needing compounded?

## 2013-10-31 NOTE — Transfer of Care (Signed)
Immediate Anesthesia Transfer of Care Note  Patient: Kathy [name]  Procedure(s) Performed: Procedure(s): POSTERIOR REPAIR (RECTOCELE) (N/A)  Patient Location: PACU  Anesthesia Type:General  Level of Consciousness: awake  Airway & Oxygen Therapy: Patient Spontanous Breathing  Post-op Assessment: Report given to PACU RN  Post vital signs: stable  Filed Vitals:   10/31/13 1144  BP: 154/90  Pulse: 72  Temp: 36.8 C  Resp: 18    Complications: No apparent anesthesia complications

## 2013-10-31 NOTE — Anesthesia Postprocedure Evaluation (Signed)
Anesthesia Post Note  Patient: Kathy [name]  Procedure(s) Performed: Procedure(s) (LRB): POSTERIOR REPAIR (RECTOCELE) (N/A)  Anesthesia type: General  Patient location: PACU  Post pain: Pain level controlled  Post assessment: Post-op Vital signs reviewed  Last Vitals:  Filed Vitals:   10/31/13 1509  BP: 136/72  Pulse: 82  Temp: 36.9 C  Resp: 16    Post vital signs: Reviewed  Level of consciousness: sedated  Complications: No apparent anesthesia complications

## 2013-10-31 NOTE — Telephone Encounter (Signed)
Pharmacy notified.

## 2013-10-31 NOTE — Anesthesia Preprocedure Evaluation (Signed)
Anesthesia Evaluation  Patient identified by MRN, date of birth, ID band Patient awake    Reviewed: Allergy & Precautions, H&P , Patient's Chart, lab work & pertinent test results, reviewed documented beta blocker date and time   History of Anesthesia Complications Negative for: history of anesthetic complications  Airway Mallampati: II TM Distance: >3 FB Neck ROM: full    Dental   Pulmonary Current Smoker,  breath sounds clear to auscultation        Cardiovascular Exercise Tolerance: Good Rhythm:regular Rate:Normal     Neuro/Psych negative psych ROS   GI/Hepatic   Endo/Other    Renal/GU      Musculoskeletal   Abdominal   Peds  Hematology   Anesthesia Other Findings   Reproductive/Obstetrics                           Anesthesia Physical Anesthesia Plan  ASA: II  Anesthesia Plan: General LMA   Post-op Pain Management:    Induction:   Airway Management Planned:   Additional Equipment:   Intra-op Plan:   Post-operative Plan:   Informed Consent: I have reviewed the patients History and Physical, chart, labs and discussed the procedure including the risks, benefits and alternatives for the proposed anesthesia with the patient or authorized representative who has indicated his/her understanding and acceptance.   Dental Advisory Given  Plan Discussed with: CRNA, Surgeon and Anesthesiologist  Anesthesia Plan Comments:         Anesthesia Quick Evaluation

## 2013-10-31 NOTE — Telephone Encounter (Signed)
3 times a week/ estradiol cream for her to start one week after her surgery  Plain lidocaine gel applied topically when necessary

## 2013-10-31 NOTE — Interval H&P Note (Signed)
History and Physical Interval Note:  10/31/2013 12:45 PM  Kathy [name]  has presented today for surgery, with the diagnosis of rectocele  The various methods of treatment have been discussed with the patient and family. After consideration of risks, benefits and other options for treatment, the patient has consented to  Procedure(s): POSTERIOR REPAIR (RECTOCELE) (N/A) as a surgical intervention .  The patient's history has been reviewed, patient examined, no change in status, stable for surgery.  I have reviewed the patient's chart and labs.  Questions were answered to the patient's satisfaction.     [name]

## 2013-10-31 NOTE — H&P (View-Only) (Signed)
Kathy [name] is an 63 y.o. female. For preoperative exam and consultation for her scheduled rectocele repair next week as a result of her symptomatic rectocele. Patient has complained of difficulty with defecation and has been occurring for several years. She takes Metamucil every morning. She reports her bowel habits are regular and soft but nevertheless she has to self digitalize herself at times to completely evacuate. She had a normal colonoscopy earlier this year but has had history of colon polyps in the past. Years ago she had a vaginal hysterectomy with ovarian conservation. She had 2 prior vaginal deliveries as well. She had been referred to the colorectal surgeon Dr. Leighton Ruff who agreed with my findings of a moderate rectocele and she reports that the patient has good rectal tone and squeeze and normal push. She will be scheduled for a rectocele repair for this redundant rectal mucosa. Patient with no vaginal prolapse.  Pertinent Gynecological History: Menses: post-menopausal Bleeding: NONE Contraception: post menopausal status DES exposure: unknown Blood transfusions: none Sexually transmitted diseases: no past history Previous GYN Procedures: TVH,BTL  Last mammogram: normal Date: 2015 Last pap: normal Date: 2012 OB History: G3, P2   Menstrual History: Menarche age: 3  No LMP recorded. Patient has had a hysterectomy.    Past Medical History  Diagnosis Date  . Fibroid   . Cystocele   . Rectocele   . Perimenopausal vasomotor symptoms   . Osteopenia   . Elevated cholesterol   . SVD (spontaneous vaginal delivery)     x 2  . Kidney stone     passed stone    Past Surgical History  Procedure Laterality Date  . Total hip arthroplasty  2001    RIGHT  . Vaginal hysterectomy  1992  . Arm surgery  2005  . Breast surgery      augmentation in 20's  . Augmentation mammaplasty      SALINE in 20's  . Tonsillectomy    . Wisdom tooth extraction    . Tubal ligation     . Colonoscopy    . Right arm surgery      tendon repair    Family History  Problem Relation Age of Onset  . Hypertension Mother   . Heart disease Mother   . Heart disease Father   . Colon cancer Neg Hx   . Esophageal cancer Neg Hx   . Rectal cancer Neg Hx   . Stomach cancer Neg Hx     Social History:  reports that she has been smoking Cigarettes.  She has been smoking about 0.10 packs per day. She has never used smokeless tobacco. She reports that she drinks about 1.2 ounces of alcohol per week. She reports that she does not use illicit drugs.  Allergies:  Allergies  Allergen Reactions  . Codeine Nausea And Vomiting     (Not in a hospital admission)  REVIEW OF SYSTEMS: A ROS was performed and pertinent positives and negatives are included in the history.  GENERAL: No fevers or chills. HEENT: No change in vision, no earache, sore throat or sinus congestion. NECK: No pain or stiffness. CARDIOVASCULAR: No chest pain or pressure. No palpitations. PULMONARY: No shortness of breath, cough or wheeze. GASTROINTESTINAL: No abdominal pain, nausea, vomiting or diarrhea, melena or bright red blood per rectum. GENITOURINARY: No urinary frequency, urgency, hesitancy or dysuria. MUSCULOSKELETAL: No joint or muscle pain, no back pain, no recent trauma. DERMATOLOGIC: No rash, no itching, no lesions. ENDOCRINE: No polyuria, polydipsia, no  heat or cold intolerance. No recent change in weight. HEMATOLOGICAL: No anemia or easy bruising or bleeding. NEUROLOGIC: No headache, seizures, numbness, tingling or weakness. PSYCHIATRIC: No depression, no loss of interest in normal activity or change in sleep pattern.     Blood pressure 130/90.  Physical Exam:  HEENT:unremarkable Neck:Supple, midline, no thyroid megaly, no carotid bruits Lungs:  Clear to auscultation no rhonchi's or wheezes Heart:Regular rate and rhythm, no murmurs or gallops Breast Exam: Both breasts are examined sitting supine position  and palpable masses or tenderness no supraclavicular axillary lymphadenopathy. Evidence of prior silicone implants Abdomen: Soft nontender no rebound or guarding large transverse abdominal scar from previous abdominoplasty Pelvic:BUS within normal limits Vagina: Large rectocele Cervix: Absent Uterus: Absent Adnexa: No palpable mass or tenderness Extremities: No cords, no edema Rectal: rectocele no enterocele when examined in the supine position  Results for orders placed during the hospital encounter of 10/23/13 (from the past 24 hour(s))  URINALYSIS, ROUTINE W REFLEX MICROSCOPIC     Status: Abnormal   Collection Time    10/23/13  2:50 PM      Result Value Ref Range   Color, Urine YELLOW  YELLOW   APPearance CLEAR  CLEAR   Specific Gravity, Urine 1.025  1.005 - 1.030   pH 6.0  5.0 - 8.0   Glucose, UA NEGATIVE  NEGATIVE mg/dL   Hgb urine dipstick SMALL (*) NEGATIVE   Bilirubin Urine NEGATIVE  NEGATIVE   Ketones, ur NEGATIVE  NEGATIVE mg/dL   Protein, ur NEGATIVE  NEGATIVE mg/dL   Urobilinogen, UA 0.2  0.0 - 1.0 mg/dL   Nitrite NEGATIVE  NEGATIVE   Leukocytes, UA NEGATIVE  NEGATIVE  URINE MICROSCOPIC-ADD ON     Status: Abnormal   Collection Time    10/23/13  2:50 PM      Result Value Ref Range   Squamous Epithelial / LPF MANY (*) RARE   WBC, UA 3-6  <3 WBC/hpf   RBC / HPF 3-6  <3 RBC/hpf   Bacteria, UA MANY (*) RARE  BASIC METABOLIC PANEL     Status: Abnormal   Collection Time    10/23/13  2:51 PM      Result Value Ref Range   Sodium 139  137 - 147 mEq/L   Potassium 3.9  3.7 - 5.3 mEq/L   Chloride 102  96 - 112 mEq/L   CO2 26  19 - 32 mEq/L   Glucose, Bld 109 (*) 70 - 99 mg/dL   BUN 13  6 - 23 mg/dL   Creatinine, Ser 0.52  0.50 - 1.10 mg/dL   Calcium 11.1 (*) 8.4 - 10.5 mg/dL   GFR calc non Af Amer >90  >90 mL/min   GFR calc Af Amer >90  >90 mL/min   Anion gap 11  5 - 15  CBC     Status: None   Collection Time    10/23/13  2:51 PM      Result Value Ref Range   WBC  6.4  4.0 - 10.5 K/uL   RBC 4.21  3.87 - 5.11 MIL/uL   Hemoglobin 13.4  12.0 - 15.0 g/dL   HCT 39.0  36.0 - 46.0 %   MCV 92.6  78.0 - 100.0 fL   MCH 31.8  26.0 - 34.0 pg   MCHC 34.4  30.0 - 36.0 g/dL   RDW 13.6  11.5 - 15.5 %   Platelets 238  150 - 400 K/uL    No results  found.  Assessment/Plan: Patient with long-standing symptomatic rectocele with good sphincter tone. She will be scheduled for a rectocele repair. I have asked her to stop her baby aspirin 3 days before her period the patient will continue on her vaginal estrogen every other day until surgery. Prescription for Percocet as well as Reglan and Motrin 800 mg was made available to patient. The risk of the procedure was follows:                        Patient was counseled as to the risk of surgery to include the following:  1. Infection (prohylactic antibiotics will be administered)  2. DVT/Pulmonary Embolism (prophylactic pneumo compression stockings will be used)  3.Trauma to internal organs requiring additional surgical procedure to repair any injury to     Internal organs requiring perhaps additional hospitalization days.  4.Hemmorhage requiring transfusion and blood products which carry risks such as  anaphylactic reaction, hepatitis and AIDS  5. Injury to rectum and/or sphincter requiring repair.  6. Vaginal narrowing which could cause pain and dyspareunia  7. Repair breakdown in the future requiring additional operations  8. Risk of vaginal rectal fistula  Patient had received literature information on the procedure scheduled and all her questions were answered and fully accepts all risk.   [facility] HMD4:57 PMTD@Note : This dictation was prepared with  Dragon/digital dictation along withSmart phrase technology. Any transcriptional errors that result from this process are unintentional.       [name] 10/23/2013, 4:24 PM  Note: This dictation was prepared with  Dragon/digital dictation along  withSmart phrase technology. Any transcriptional errors that result from this process are unintentional.

## 2013-11-01 ENCOUNTER — Encounter (HOSPITAL_COMMUNITY): Payer: Self-pay | Admitting: Gynecology

## 2013-11-01 DIAGNOSIS — N816 Rectocele: Secondary | ICD-10-CM | POA: Diagnosis not present

## 2013-11-01 LAB — CBC
HEMATOCRIT: 36.8 % (ref 36.0–46.0)
HEMOGLOBIN: 12.2 g/dL (ref 12.0–15.0)
MCH: 31 pg (ref 26.0–34.0)
MCHC: 33.2 g/dL (ref 30.0–36.0)
MCV: 93.6 fL (ref 78.0–100.0)
Platelets: 242 10*3/uL (ref 150–400)
RBC: 3.93 MIL/uL (ref 3.87–5.11)
RDW: 14.1 % (ref 11.5–15.5)
WBC: 12.6 10*3/uL — ABNORMAL HIGH (ref 4.0–10.5)

## 2013-11-01 MED ORDER — INFLUENZA VAC SPLIT QUAD 0.5 ML IM SUSY
0.5000 mL | PREFILLED_SYRINGE | INTRAMUSCULAR | Status: DC
Start: 2013-11-01 — End: 2016-07-21

## 2013-11-01 NOTE — Progress Notes (Signed)
Pt is discharged in the care of husband with [employer]. Denies any pain or discomfort. Ambulating in room and hallway. Tolerating well.Discharged  Instructions with Rx  Were given to pt . Questions asked and answered.Denies heavy vaginal bleeding. Stable.

## 2013-11-01 NOTE — Discharge Summary (Signed)
Physician Discharge Summary  Patient ID: Kathy [name] MRN: [id number] DOB/AGE: September 25, 1950 63 y.o.  Admit date: 10/31/2013 Discharge date: 11/01/2013  Admission Diagnoses: Symptomatic rectocele  Discharge Diagnoses: Same Active Problems:   Postoperative state   Discharged Condition: good  Hospital Course: The patient was admitted to the hospital on September 9 where she underwent a posterior colporrhaphy. Patient had Foley catheter over night which was removed this morning. Patient with adequate urine output the patient voided spontaneously 75 cc this morning. The patient has been up and ambulating tolerating regular diet well and ready to be discharged home today.  Consults: None  Significant Diagnostic Studies: labs: Discharge hemoglobin 12.2 g  Treatments: surgery: Posterior colporrhaphy  Discharge Exam: Blood pressure 120/65, pulse 58, temperature 98.1 F (36.7 C), temperature source Oral, resp. rate 18, height 5' (1.524 m), weight 154 lb (69.854 kg), SpO2 96.00%. Patient was alert and oriented and she was ready to be discharged home today Lungs: Clear to auscultation heart is or wheezes Heart: Regular rate and rhythm no murmurs or gallops Abdomen: Soft nontender no rebound guarding Pelvic exam: Not done vaginal pad is dry Extremities: No cords or edema  Disposition: Final discharge disposition not confirmed  Discharge Instructions   Call MD for:  redness, tenderness, or signs of infection (pain, swelling, bleeding, redness, odor or green/yellow discharge around incision site)    Complete by:  As directed      Call MD for:  severe or increased pain, loss or decreased feeling  in affected limb(s)    Complete by:  As directed      Call MD for:  temperature >100.5    Complete by:  As directed      Discharge instructions    Complete by:  As directed   Post op appointment to see Dr. Toney Rakes October 2 at 12:30pm. Will mail you Kegel exercises instructions today General  Postsurgical Instructions, Adult  You may expect to feel dizzy, weak, and drowsy for as long as 24 hours after receiving the medicine that made you sleep (anesthetic). The following information pertains to your recovery period for the first 24 hours following surgery. Do not drive a car, ride a bicycle, participate in physical activities, or take public transportation until you are done taking narcotic pain medicines or as directed by your caregiver.  Do not drink alcohol or take tranquilizers.  Do not take medicine that has not been prescribed by your caregiver.  Do not sign important papers or make important decisions while on narcotic pain medicines.  Have a responsible person with you.  CARE OF INCISION Change bandages (dressings) as directed.  Take showers instead of baths until your caregiver gives you permission to take baths. Check with your caregiver if you have tubes coming from the wound site.  Avoid heavy lifting (more than 10 pounds/4.5 kilograms), pushing, or pulling.  Avoid activities that may risk injury to your surgical site.  Only take over-the-counter or prescription medicines for pain, discomfort, or fever as directed by your caregiver. Do not take aspirin. It can make you bleed. Take medicines (antibiotics) that kill germs as directed.  SEEK MEDICAL CARE IF: You feel sick to your stomach (nauseous).  You start to throw up (vomit).  You have trouble eating or drinking.  You have an oral temperature above 100.  You have constipation that is not helped by adjusting diet or increasing fluid intake. Pain medicines are a common cause of constipation.  SEEK IMMEDIATE MEDICAL CARE IF: You  have persistent dizziness.  You have difficulty breathing or a congested sounding (croupy) cough.  You have an oral temperature above 100, not controlled by medicine.  There is increasing pain or tenderness near or in the surgical site.  Document Released: 05/23/2000 Document Re-Released:  05/05/2009 [facility] Patient Information 2011 [place].   [facility] HMD8:03 AMTD@     Driving Restrictions    Complete by:  As directed   No driving for 1 weeks     Lifting restrictions    Complete by:  As directed   No lifting for 6 weeks     Resume previous diet    Complete by:  As directed             Medication List         aspirin 81 MG tablet  Take 81 mg by mouth daily.     b complex vitamins tablet  Take 1 tablet by mouth daily.     CALCIUM + D PO  Take 1 tablet by mouth daily.     cholecalciferol 1000 UNITS tablet  Commonly known as:  VITAMIN D  Take 1,000 Units by mouth daily.     COQ10 PO  Take 1 capsule by mouth daily.     diazepam 10 MG tablet  Commonly known as:  VALIUM  Take 10 mg by mouth every 6 (six) hours as needed for anxiety (pain).     docusate sodium 100 MG capsule  Commonly known as:  COLACE  Take one at bedtime     estradiol 0.1 MG/GM vaginal cream  Commonly known as:  ESTRACE  Place 1 Applicatorful vaginally every other day. Insert vaginally every other hs until surgery.     FISH OIL PO  Take 1 capsule by mouth daily.     ibuprofen 800 MG tablet  Commonly known as:  ADVIL,MOTRIN  Take 1 tablet (800 mg total) by mouth every 8 (eight) hours as needed.     Influenza vac split quadrivalent PF 0.5 ML injection  Commonly known as:  FLUARIX  Inject 0.5 mLs into the muscle tomorrow at 10 am.     metoCLOPramide 10 MG tablet  Commonly known as:  REGLAN  Take 1 tablet (10 mg total) by mouth 3 (three) times daily with meals.     oxyCODONE-acetaminophen 5-325 MG per tablet  Commonly known as:  PERCOCET/ROXICET  Take 1 tablet by mouth every 6 (six) hours as needed for severe pain.     simvastatin 40 MG tablet  Commonly known as:  ZOCOR  Take 20 mg by mouth daily. Takes 1/2 of 40mg  tablet     vitamin E 1000 UNIT capsule  Generic drug:  vitamin E  Take 2,000 Units by mouth daily.        patient will be reminded to use  the lidocaine gel when necessary for the first week postop. Then she should start the estrogen vaginal cream 3 times a week. She will be seen in the office in 3 weeks for her postop visit.  SignedTerrance Mass 11/01/2013, 8:05 AM

## 2013-11-01 NOTE — Addendum Note (Signed)
Addendum created 11/01/13 0748 by Raenette Rover, CRNA   Modules edited: Notes Section   Notes Section:  File: [id number]

## 2013-11-01 NOTE — Anesthesia Postprocedure Evaluation (Signed)
  Anesthesia Post-op Note  Patient: Kathy [name]  Procedure(s) Performed: Procedure(s): POSTERIOR REPAIR (RECTOCELE) (N/A)  Patient Location: PACU and Women[facility] Unit  Anesthesia Type:General  Level of Consciousness: awake, alert , oriented and patient cooperative  Airway and Oxygen Therapy: Patient Spontanous Breathing  Post-op Pain: mild  Post-op Assessment: Post-op Vital signs reviewed, Patient's Cardiovascular Status Stable, Respiratory Function Stable, Patent Airway, No signs of Nausea or vomiting and Pain level controlled  Post-op Vital Signs: Reviewed and stable  Last Vitals:  Filed Vitals:   11/01/13 0533  BP: 120/65  Pulse: 58  Temp: 36.7 C  Resp: 18    Complications: No apparent anesthesia complications

## 2013-11-07 DIAGNOSIS — Z0289 Encounter for other administrative examinations: Secondary | ICD-10-CM

## 2013-11-14 ENCOUNTER — Ambulatory Visit: Payer: BC Managed Care – PPO | Admitting: Gynecology

## 2013-11-23 ENCOUNTER — Encounter: Payer: Self-pay | Admitting: Gynecology

## 2013-11-23 ENCOUNTER — Ambulatory Visit (INDEPENDENT_AMBULATORY_CARE_PROVIDER_SITE_OTHER): Payer: BC Managed Care – PPO | Admitting: Gynecology

## 2013-11-23 VITALS — BP 128/78

## 2013-11-23 DIAGNOSIS — Z09 Encounter for follow-up examination after completed treatment for conditions other than malignant neoplasm: Secondary | ICD-10-CM

## 2013-11-23 NOTE — Progress Notes (Signed)
   63 year old that presented to the office today for a 3 week postop visit. On September 9 patient underwent posterior colporrhaphy as a result of symptomatic rectocele. She is very happy so far with a result of her surgery is having normal bowel movements and having good control. She continues to use her stool softener and her vaginal estrogen cream.  Exam: Bartholin urethra Skene was within normal limits Vagina almost completely healed from posterior colporrhaphy small suture at the perineum was removed. Digital exam demonstrated intact vaginal mucosa and nontender. Rectovaginal exam unremarkable.  Assessment/plan: Patient 3 weeks status post posterior colporrhaphy done well. She will now apply the vaginal estrogen cream twice a week instead of 3 times a week. She will return to the office in 3 weeks for her final postoperative visit before she returns to work. The patient received her flu vaccine while in the hospital.

## 2013-11-23 NOTE — Patient Instructions (Signed)
Kegel Exercises The goal of Kegel exercises is to isolate and exercise your pelvic floor muscles. These muscles act as a hammock that supports the rectum, vagina, small intestine, and uterus. As the muscles weaken, the hammock sags and these organs are displaced from their normal positions. Kegel exercises can strengthen your pelvic floor muscles and help you to improve bladder and bowel control, improve sexual response, and help reduce many problems and some discomfort during pregnancy. Kegel exercises can be done anywhere and at any time. HOW TO PERFORM KEGEL EXERCISES 1. Locate your pelvic floor muscles. To do this, squeeze (contract) the muscles that you use when you try to stop the flow of urine. You will feel a tightness in the vaginal area (women) and a tight lift in the rectal area (men and women). 2. When you begin, contract your pelvic muscles tight for 2-5 seconds, then relax them for 2-5 seconds. This is one set. Do 4-5 sets with a short pause in between. 3. Contract your pelvic muscles for 8-10 seconds, then relax them for 8-10 seconds. Do 4-5 sets. If you cannot contract your pelvic muscles for 8-10 seconds, try 5-7 seconds and work your way up to 8-10 seconds. Your goal is 4-5 sets of 10 contractions each day. Keep your stomach, buttocks, and legs relaxed during the exercises. Perform sets of both short and long contractions. Vary your positions. Perform these contractions 3-4 times per day. Perform sets while you are:   Lying in bed in the morning.  Standing at lunch.  Sitting in the late afternoon.  Lying in bed at night. You should do 40-50 contractions per day. Do not perform more Kegel exercises per day than recommended. Overexercising can cause muscle fatigue. Continue these exercises for for at least 15-20 weeks or as directed by your caregiver. Document Released: 01/26/2012 Document Reviewed: 01/26/2012 [employer] Patient Information 2015 [employer]. This information is  not intended to replace advice given to you by your health care provider. Make sure you discuss any questions you have with your health care provider.  

## 2013-12-14 ENCOUNTER — Encounter: Payer: Self-pay | Admitting: Gynecology

## 2013-12-14 ENCOUNTER — Ambulatory Visit (INDEPENDENT_AMBULATORY_CARE_PROVIDER_SITE_OTHER): Payer: BC Managed Care – PPO | Admitting: Gynecology

## 2013-12-14 VITALS — BP 146/90

## 2013-12-14 DIAGNOSIS — Z09 Encounter for follow-up examination after completed treatment for conditions other than malignant neoplasm: Secondary | ICD-10-CM

## 2013-12-14 MED ORDER — TRAMADOL HCL 50 MG PO TABS: 50.0000 mg | ORAL_TABLET | Freq: Four times a day (QID) | ORAL | Status: DC | PRN

## 2013-12-14 NOTE — Progress Notes (Signed)
   Patient presented to the office for her six-week postop visit.On September 9 patient underwent posterior colporrhaphy as a result of symptomatic rectocele. She is very happy so far with a result of her surgery is having normal bowel movements and having good control. She continues to use her stool softener and her vaginal estrogen cream.   Exam:  Bartholin urethra Skene was within normal limits  Vagina almost completely healed from posterior colporrhaphy small suture at the perineum was removed. Digital exam demonstrated intact vaginal mucosa and nontender. Rectovaginal exam unremarkable.    Assessment/plan: Patient 6 weeks status post posterior colporrhaphy done well. She will continue with her Kegel exercises and stool softeners. She will return back to work next week. Patient not sexually active. Patient will return back to the office next year for annual exam or when necessary. She will continue to apply vaginal estrogen cream twice a week as well intravaginal.

## 2013-12-24 ENCOUNTER — Encounter: Payer: Self-pay | Admitting: Gynecology

## 2014-07-01 ENCOUNTER — Other Ambulatory Visit: Payer: Self-pay | Admitting: Gynecology

## 2014-07-01 DIAGNOSIS — Z1231 Encounter for screening mammogram for malignant neoplasm of breast: Secondary | ICD-10-CM

## 2014-07-12 ENCOUNTER — Ambulatory Visit (INDEPENDENT_AMBULATORY_CARE_PROVIDER_SITE_OTHER): Payer: Medicare Other | Admitting: Gynecology

## 2014-07-12 ENCOUNTER — Telehealth: Payer: Self-pay | Admitting: *Deleted

## 2014-07-12 ENCOUNTER — Encounter: Payer: Self-pay | Admitting: Gynecology

## 2014-07-12 VITALS — BP 128/82 | Ht 60.5 in

## 2014-07-12 DIAGNOSIS — M8580 Other specified disorders of bone density and structure, unspecified site: Secondary | ICD-10-CM

## 2014-07-12 DIAGNOSIS — N952 Postmenopausal atrophic vaginitis: Secondary | ICD-10-CM

## 2014-07-12 DIAGNOSIS — Z79890 Hormone replacement therapy (postmenopausal): Secondary | ICD-10-CM

## 2014-07-12 DIAGNOSIS — G4700 Insomnia, unspecified: Secondary | ICD-10-CM

## 2014-07-12 DIAGNOSIS — Z01419 Encounter for gynecological examination (general) (routine) without abnormal findings: Secondary | ICD-10-CM

## 2014-07-12 MED ORDER — NONFORMULARY OR COMPOUNDED ITEM
Status: DC
Start: 2014-07-12 — End: 2015-10-06

## 2014-07-12 MED ORDER — DIAZEPAM 10 MG PO TABS: 10.0000 mg | ORAL_TABLET | Freq: Two times a day (BID) | ORAL | Status: DC | PRN

## 2014-07-12 MED ORDER — ESTRADIOL 0.1 MG/GM VA CREA: 2.0000 g | TOPICAL_CREAM | VAGINAL | Status: DC

## 2014-07-12 NOTE — Telephone Encounter (Signed)
Tell her that she does that there is is that A pharmacy the estradiol vaginal cream twice a week and average cause is approximate $50 for 3 months we can call that if she would like with 4 refills.

## 2014-07-12 NOTE — Progress Notes (Signed)
Kathy [name] [birth year] [id number]   History:    64 y.o.  for annual gyn exam with no complaints. Patient in September 2015 had posterior colporrhaphy. Several years prior patient had a transvaginal hysterectomy. Patient is doing well having normal bowel movements and no complaints. She had stopped the Estrace vaginal cream she believed that she didn't have to continue it. Patient with no past history of abnormal Pap smears. Her last bone density study in 2009 had demonstrated her lowest T score was at the AP spine with a value of -1.6 with normal Frax analysis. Patient had a colonoscopy in 2014 it is on a 5 year recall because of past history of benign polyps having been removed. She takes Valium 10 mg when necessary anxiety or insomnia. Patient recently retired.  Past medical history,surgical history, family history and social history were all reviewed and documented in the EPIC chart.  Gynecologic History No LMP recorded. Patient has had a hysterectomy. Contraception: status post hysterectomy Last Pap: 2012. Results were: normal Last mammogram: 2015. Results were: normal  Obstetric History OB History  Gravida Para Term Preterm AB SAB TAB Ectopic Multiple Living  3 2   1     2     # Outcome Date GA Lbr Len/2nd Weight Sex Delivery Anes PTL Lv  3 AB           2 Para           1 Para                ROS: A ROS was performed and pertinent positives and negatives are included in the history.  GENERAL: No fevers or chills. HEENT: No change in vision, no earache, sore throat or sinus congestion. NECK: No pain or stiffness. CARDIOVASCULAR: No chest pain or pressure. No palpitations. PULMONARY: No shortness of breath, cough or wheeze. GASTROINTESTINAL: No abdominal pain, nausea, vomiting or diarrhea, melena or bright red blood per rectum. GENITOURINARY: No urinary frequency, urgency, hesitancy or dysuria. MUSCULOSKELETAL: No joint or muscle pain, no back pain, no recent trauma.  DERMATOLOGIC: No rash, no itching, no lesions. ENDOCRINE: No polyuria, polydipsia, no heat or cold intolerance. No recent change in weight. HEMATOLOGICAL: No anemia or easy bruising or bleeding. NEUROLOGIC: No headache, seizures, numbness, tingling or weakness. PSYCHIATRIC: No depression, no loss of interest in normal activity or change in sleep pattern.     Exam: chaperone present  BP 128/82 mmHg  Ht 5' 0.5" (1.537 m)  Wt   There is no weight on file to calculate BMI.  General appearance : Well developed well nourished female. No acute distress HEENT: Eyes: no retinal hemorrhage or exudates,  Neck supple, trachea midline, no carotid bruits, no thyroidmegaly Lungs: Clear to auscultation, no rhonchi or wheezes, or rib retractions  Heart: Regular rate and rhythm, no murmurs or gallops Breast:Examined in sitting and supine position were symmetrical in appearance, no palpable masses or tenderness,  no skin retraction, no nipple inversion, no nipple discharge, no skin discoloration, no axillary or supraclavicular lymphadenopathy Abdomen: no palpable masses or tenderness, no rebound or guarding Extremities: no edema or skin discoloration or tenderness  Pelvic:  Bartholin, Urethra, Skene Glands: Within normal limits             Vagina: No gross lesions or discharge  Cervix: Absent  Uterus absent  Adnexa  Without masses or tenderness  Anus and perineum  normal   Rectovaginal  normal sphincter tone without palpated masses or tenderness  Hemoccult PCP provides     Assessment/Plan:  64 y.o. female for annual exam who 9 months ago had a posterior colporrhaphy doing well. Prescription refill for Estrace vaginal cream to apply twice a week was provided. Also prescription refill for Valium 10 mg to take 1 by mouth daily when necessary insomnia or anxiety. Patient was reminded to have her mammogram later this year. We discussed importance of calcium vitamin D and regular exercise for  osteoporosis prevention. Patient to schedule bone density study here in the office in the next few weeks. Pap smear not done in accordance to the new guidelines.   [name] MD, 11:34 AM 07/12/2014

## 2014-07-12 NOTE — Telephone Encounter (Signed)
Pt was prescribed  estrace vaginal cream today went pick Rx and price has increased to $252. Pt asked if Rx could be switched. Please advise

## 2014-07-12 NOTE — Telephone Encounter (Signed)
Pt said she would like to try, Rx called in to [place]

## 2014-07-30 ENCOUNTER — Other Ambulatory Visit: Payer: Self-pay | Admitting: Gynecology

## 2014-07-30 ENCOUNTER — Ambulatory Visit (INDEPENDENT_AMBULATORY_CARE_PROVIDER_SITE_OTHER): Payer: Medicare Other

## 2014-07-30 DIAGNOSIS — M8580 Other specified disorders of bone density and structure, unspecified site: Secondary | ICD-10-CM

## 2014-07-30 DIAGNOSIS — Z13820 Encounter for screening for osteoporosis: Secondary | ICD-10-CM

## 2014-08-01 ENCOUNTER — Ambulatory Visit: Payer: Medicare Other

## 2014-08-01 DIAGNOSIS — Z1231 Encounter for screening mammogram for malignant neoplasm of breast: Secondary | ICD-10-CM

## 2014-08-06 ENCOUNTER — Other Ambulatory Visit: Payer: Self-pay | Admitting: *Deleted

## 2014-08-08 ENCOUNTER — Other Ambulatory Visit: Payer: Self-pay

## 2014-08-19 ENCOUNTER — Other Ambulatory Visit: Payer: Self-pay

## 2014-10-03 ENCOUNTER — Other Ambulatory Visit (HOSPITAL_COMMUNITY): Payer: Self-pay | Admitting: Endocrinology

## 2014-10-03 ENCOUNTER — Other Ambulatory Visit: Payer: Self-pay | Admitting: Gynecology

## 2014-10-03 DIAGNOSIS — E213 Hyperparathyroidism, unspecified: Secondary | ICD-10-CM

## 2014-10-03 NOTE — Telephone Encounter (Signed)
Called into pharmacy

## 2014-10-15 ENCOUNTER — Encounter (HOSPITAL_COMMUNITY): Payer: Medicare Other

## 2014-10-21 DIAGNOSIS — E210 Primary hyperparathyroidism: Secondary | ICD-10-CM | POA: Insufficient documentation

## 2015-06-30 ENCOUNTER — Other Ambulatory Visit: Payer: Self-pay | Admitting: Gynecology

## 2015-06-30 DIAGNOSIS — Z1231 Encounter for screening mammogram for malignant neoplasm of breast: Secondary | ICD-10-CM

## 2015-07-14 ENCOUNTER — Ambulatory Visit (INDEPENDENT_AMBULATORY_CARE_PROVIDER_SITE_OTHER): Payer: Medicare Other | Admitting: Gynecology

## 2015-07-14 ENCOUNTER — Encounter: Payer: Self-pay | Admitting: Gynecology

## 2015-07-14 VITALS — BP 140/84 | Ht 61.0 in | Wt 157.4 lb

## 2015-07-14 DIAGNOSIS — M8580 Other specified disorders of bone density and structure, unspecified site: Secondary | ICD-10-CM

## 2015-07-14 DIAGNOSIS — R102 Pelvic and perineal pain: Secondary | ICD-10-CM | POA: Diagnosis not present

## 2015-07-14 DIAGNOSIS — Z780 Asymptomatic menopausal state: Secondary | ICD-10-CM | POA: Diagnosis not present

## 2015-07-14 DIAGNOSIS — Z01419 Encounter for gynecological examination (general) (routine) without abnormal findings: Secondary | ICD-10-CM

## 2015-07-14 DIAGNOSIS — I10 Essential (primary) hypertension: Secondary | ICD-10-CM | POA: Diagnosis not present

## 2015-07-14 MED ORDER — HYDROCHLOROTHIAZIDE 12.5 MG PO CAPS: 12.5000 mg | ORAL_CAPSULE | Freq: Every day | ORAL | Status: DC

## 2015-07-14 MED ORDER — DIAZEPAM 10 MG PO TABS: 10.0000 mg | ORAL_TABLET | Freq: Four times a day (QID) | ORAL | Status: DC | PRN

## 2015-07-14 MED ORDER — ESTRADIOL 0.1 MG/GM VA CREA: 2.0000 g | TOPICAL_CREAM | VAGINAL | Status: DC

## 2015-07-14 NOTE — Patient Instructions (Signed)
Hydrochlorothiazide, HCTZ capsules or tablets  What is this medicine?  HYDROCHLOROTHIAZIDE (hye droe klor oh THYE a zide) is a diuretic. It increases the amount of urine passed, which causes the body to lose salt and water. This medicine is used to treat high blood pressure. It is also reduces the swelling and water retention caused by various medical conditions, such as heart, liver, or kidney disease.  This medicine may be used for other purposes; ask your health care provider or pharmacist if you have questions.  What should I tell my health care provider before I take this medicine?  They need to know if you have any of these conditions:  -diabetes  -gout  -immune system problems, like lupus  -kidney disease or kidney stones  -liver disease  -pancreatitis  -small amount of urine or difficulty passing urine  -an unusual or allergic reaction to hydrochlorothiazide, sulfa drugs, other medicines, foods, dyes, or preservatives  -pregnant or trying to get pregnant  -breast-feeding  How should I use this medicine?  Take this medicine by mouth with a glass of water. Follow the directions on the prescription label. Take your medicine at regular intervals. Remember that you will need to pass urine frequently after taking this medicine. Do not take your doses at a time of day that will cause you problems. Do not stop taking your medicine unless your doctor tells you to.  Talk to your pediatrician regarding the use of this medicine in children. Special care may be needed.  Overdosage: If you think you have taken too much of this medicine contact a poison control center or emergency room at once.  NOTE: This medicine is only for you. Do not share this medicine with others.  What if I miss a dose?  If you miss a dose, take it as soon as you can. If it is almost time for your next dose, take only that dose. Do not take double or extra doses.  What may interact with this  medicine?  -cholestyramine  -colestipol  -digoxin  -dofetilide  -lithium  -medicines for blood pressure  -medicines for diabetes  -medicines that relax muscles for surgery  -other diuretics  -steroid medicines like prednisone or cortisone  This list may not describe all possible interactions. Give your health care provider a list of all the medicines, herbs, non-prescription drugs, or dietary supplements you use. Also tell them if you smoke, drink alcohol, or use illegal drugs. Some items may interact with your medicine.  What should I watch for while using this medicine?  Visit your doctor or health care professional for regular checks on your progress. Check your blood pressure as directed. Ask your doctor or health care professional what your blood pressure should be and when you should contact him or her.  You may need to be on a special diet while taking this medicine. Ask your doctor.  Check with your doctor or health care professional if you get an attack of severe diarrhea, nausea and vomiting, or if you sweat a lot. The loss of too much body fluid can make it dangerous for you to take this medicine.  You may get drowsy or dizzy. Do not drive, use machinery, or do anything that needs mental alertness until you know how this medicine affects you. Do not stand or sit up quickly, especially if you are an older patient. This reduces the risk of dizzy or fainting spells. Alcohol may interfere with the effect of this medicine. Avoid alcoholic drinks.    out of the sun. If you cannot avoid being in the sun, wear protective clothing and use sunscreen. Do not use sun lamps or tanning beds/booths. What side effects may I notice from receiving this medicine? Side effects that you should  report to your doctor or health care professional as soon as possible: -allergic reactions such as skin rash or itching, hives, swelling of the lips, mouth, tongue, or throat -changes in vision -chest pain -eye pain -fast or irregular heartbeat -feeling faint or lightheaded, falls -gout attack -muscle pain or cramps -pain or difficulty when passing urine -pain, tingling, numbness in the hands or feet -redness, blistering, peeling or loosening of the skin, including inside the mouth -unusually weak or tired Side effects that usually do not require medical attention (report to your doctor or health care professional if they continue or are bothersome): -change in sex drive or performance -dry mouth -headache -stomach upset This list may not describe all possible side effects. Call your doctor for medical advice about side effects. You may report side effects to FDA at 1-800-FDA-1088. Where should I keep my medicine? Keep out of the reach of children. Store at room temperature between 15 and 30 degrees C (59 and 86 degrees F). Do not freeze. Protect from light and moisture. Keep container closed tightly. Throw away any unused medicine after the expiration date. NOTE: This sheet is a summary. It may not cover all possible information. If you have questions about this medicine, talk to your doctor, pharmacist, or health care provider.    2016, Elsevier/Gold Standard. (2009-10-03 12:57:37) Hypertension Hypertension, commonly called high blood pressure, is when the force of blood pumping through your arteries is too strong. Your arteries are the blood vessels that carry blood from your heart throughout your body. A blood pressure reading consists of a higher number over a lower number, such as 110/72. The higher number (systolic) is the pressure inside your arteries when your heart pumps. The lower number (diastolic) is the pressure inside your arteries when your heart relaxes. Ideally you want  your blood pressure below 120/80. Hypertension forces your heart to work harder to pump blood. Your arteries may become narrow or stiff. Having untreated or uncontrolled hypertension can cause heart attack, stroke, kidney disease, and other problems. RISK FACTORS Some risk factors for high blood pressure are controllable. Others are not.  Risk factors you cannot control include:   Race. You may be at higher risk if you are African American.  Age. Risk increases with age.  Gender. Men are at higher risk than women before age 33 years. After age 37, women are at higher risk than men. Risk factors you can control include:  Not getting enough exercise or physical activity.  Being overweight.  Getting too much fat, sugar, calories, or salt in your diet.  Drinking too much alcohol. SIGNS AND SYMPTOMS Hypertension does not usually cause signs or symptoms. Extremely high blood pressure (hypertensive crisis) may cause headache, anxiety, shortness of breath, and nosebleed. DIAGNOSIS To check if you have hypertension, your health care provider will measure your blood pressure while you are seated, with your arm held at the level of your heart. It should be measured at least twice using the same arm. Certain conditions can cause a difference in blood pressure between your right and left arms. A blood pressure reading that is higher than normal on one occasion does not mean that you need treatment. If it is not clear whether you have high blood pressure,  you may be asked to return on a different day to have your blood pressure checked again. Or, you may be asked to monitor your blood pressure at home for 1 or more weeks. TREATMENT Treating high blood pressure includes making lifestyle changes and possibly taking medicine. Living a healthy lifestyle can help lower high blood pressure. You may need to change some of your habits. Lifestyle changes may include:  Following the DASH diet. This diet is high  in fruits, vegetables, and whole grains. It is low in salt, red meat, and added sugars.  Keep your sodium intake below 2,300 mg per day.  Getting at least 30-45 minutes of aerobic exercise at least 4 times per week.  Losing weight if necessary.  Not smoking.  Limiting alcoholic beverages.  Learning ways to reduce stress. Your health care provider may prescribe medicine if lifestyle changes are not enough to get your blood pressure under control, and if one of the following is true:  You are 73-3 years of age and your systolic blood pressure is above 140.  You are 26 years of age or older, and your systolic blood pressure is above 150.  Your diastolic blood pressure is above 90.  You have diabetes, and your systolic blood pressure is over [id number] or your diastolic blood pressure is over 90.  You have kidney disease and your blood pressure is above 140/90.  You have heart disease and your blood pressure is above 140/90. Your personal target blood pressure may vary depending on your medical conditions, your age, and other factors. HOME CARE INSTRUCTIONS  Have your blood pressure rechecked as directed by your health care provider.   Take medicines only as directed by your health care provider. Follow the directions carefully. Blood pressure medicines must be taken as prescribed. The medicine does not work as well when you skip doses. Skipping doses also puts you at risk for problems.  Do not smoke.   Monitor your blood pressure at home as directed by your health care provider. SEEK MEDICAL CARE IF:   You think you are having a reaction to medicines taken.  You have recurrent headaches or feel dizzy.  You have swelling in your ankles.  You have trouble with your vision. SEEK IMMEDIATE MEDICAL CARE IF:  You develop a severe headache or confusion.  You have unusual weakness, numbness, or feel faint.  You have severe chest or abdominal pain.  You vomit repeatedly.  You  have trouble breathing. MAKE SURE YOU:   Understand these instructions.  Will watch your condition.  Will get help right away if you are not doing well or get worse.   This information is not intended to replace advice given to you by your health care provider. Make sure you discuss any questions you have with your health care provider.   Document Released: 02/08/2005 Document Revised: 06/25/2014 Document Reviewed: 12/01/2012 Elsevier Interactive Patient Education [employer].

## 2015-07-14 NOTE — Progress Notes (Signed)
Kathy [name] 04/30/1950 [id number]   History:    65 y.o.  for annual gyn exam  With complaints of on and off right lower quadrant discomfort for the past few weeks. Patient recently was treated for diarrhea by her PCP it is scheduled to see him within the next week. She was found to have elevated blood pressure when she came in to the office at 148/92.She had stopped the Estrace vaginal cream she believed that she didn't have to continue it. Patient with no past history of abnormal Pap smears. Her last bone density study in  In 2016 demonstrated her lowest T score was -2.4 at the [facility].Patient had a colonoscopy in 2014 it is on a 5 year recall because of past history of benign polyps having been removed. She takes Valium 10 mg when necessary anxiety or insomnia. Patient recently retired.  Past medical history,surgical history, family history and social history were all reviewed and documented in the EPIC chart.  Gynecologic History No LMP recorded. Patient has had a hysterectomy. Contraception: post menopausal status Last Pap:  2012. Results were: normal Last mammogram:  2016. Results were: normal  Obstetric History OB History  Gravida Para Term Preterm AB SAB TAB Ectopic Multiple Living  3 2   1     2     # Outcome Date GA Lbr Len/2nd Weight Sex Delivery Anes PTL Lv  3 AB           2 Para           1 Para                ROS: A ROS was performed and pertinent positives and negatives are included in the history.  GENERAL: No fevers or chills. HEENT: No change in vision, no earache, sore throat or sinus congestion. NECK: No pain or stiffness. CARDIOVASCULAR: No chest pain or pressure. No palpitations. PULMONARY: No shortness of breath, cough or wheeze. GASTROINTESTINAL: No abdominal pain, nausea, vomiting or diarrhea, melena or bright red blood per rectum. GENITOURINARY: No urinary frequency, urgency, hesitancy or dysuria. MUSCULOSKELETAL: No joint or muscle pain, no back pain, no  recent trauma. DERMATOLOGIC: No rash, no itching, no lesions. ENDOCRINE: No polyuria, polydipsia, no heat or cold intolerance. No recent change in weight. HEMATOLOGICAL: No anemia or easy bruising or bleeding. NEUROLOGIC: No headache, seizures, numbness, tingling or weakness. PSYCHIATRIC: No depression, no loss of interest in normal activity or change in sleep pattern.     Exam: chaperone present  BP 140/84 mmHg  Ht 5\' 1"  (1.549 m)  Wt 157 lb 6.4 oz (71.396 kg)  BMI 29.76 kg/m2  Body mass index is 29.76 kg/(m^2).  General appearance : Well developed well nourished female. No acute distress HEENT: Eyes: no retinal hemorrhage or exudates,  Neck supple, trachea midline, no carotid bruits, no thyroidmegaly Lungs: Clear to auscultation, no rhonchi or wheezes, or rib retractions  Heart: Regular rate and rhythm, no murmurs or gallops Breast:Examined in sitting and supine position were symmetrical in appearance, no palpable masses or tenderness,  no skin retraction, no nipple inversion, no nipple discharge, no skin discoloration, no axillary or supraclavicular lymphadenopathy Abdomen: no palpable masses or tenderness, no rebound or guarding Extremities: no edema or skin discoloration or tenderness  Pelvic:  Bartholin, Urethra, Skene Glands: Within normal limits             Vagina: No gross lesions or discharge  Cervix:  absent  Uterus   absent  Adnexa  Without masses or tenderness  Anus and perineum  normal   Rectovaginal  normal sphincter tone without palpated masses or tenderness             Hemoccult  PCP provides     Assessment/Plan:  65 y.o. female for annual exam  Was provided with a prescription refill for vaginal estrogen to apply twice a week as needed her vaginal atrophy. Pap smear not indicated. Patient to return back to the office sometime next week for an ultrasound for better assessment of her right ovary because of her on and off discomfort. Because of her blood pressure she's  going to be started on HCTZ 12.5 mg daily have asked her to keep a long of her blood pressure readings twice a day and to take with her when she sees her PCP next week so that he can see the readings. Since this is a diuretic I have encouraged her also take fruit during the day to maintain her potassium low normal.  We discussed importance of calcium vitamin D and weightbearing exercise for osteoporosis prevention. Next year she will need a bone density study.   [name] MD, 11:40 AM 07/14/2015

## 2015-08-06 ENCOUNTER — Ambulatory Visit: Payer: Medicare Other

## 2015-08-06 ENCOUNTER — Other Ambulatory Visit (HOSPITAL_BASED_OUTPATIENT_CLINIC_OR_DEPARTMENT_OTHER): Payer: Self-pay | Admitting: Family Medicine

## 2015-08-06 DIAGNOSIS — Z139 Encounter for screening, unspecified: Secondary | ICD-10-CM

## 2015-08-07 ENCOUNTER — Ambulatory Visit (HOSPITAL_BASED_OUTPATIENT_CLINIC_OR_DEPARTMENT_OTHER)
Admission: RE | Admit: 2015-08-07 | Discharge: 2015-08-07 | Disposition: A | Discharge status: Home / Self Care | Payer: Medicare Other | Source: Ambulatory Visit | Attending: Family Medicine | Admitting: Family Medicine

## 2015-08-07 DIAGNOSIS — Z1231 Encounter for screening mammogram for malignant neoplasm of breast: Secondary | ICD-10-CM | POA: Insufficient documentation

## 2015-08-07 DIAGNOSIS — Z139 Encounter for screening, unspecified: Secondary | ICD-10-CM

## 2015-10-06 ENCOUNTER — Other Ambulatory Visit: Payer: Self-pay

## 2015-10-06 MED ORDER — NONFORMULARY OR COMPOUNDED ITEM: 3 refills | Status: DC

## 2016-03-17 ENCOUNTER — Other Ambulatory Visit: Payer: Self-pay | Admitting: Gynecology

## 2016-03-17 NOTE — Telephone Encounter (Signed)
Called into pharmacy

## 2016-06-28 ENCOUNTER — Other Ambulatory Visit: Payer: Self-pay | Admitting: Gynecology

## 2016-06-28 NOTE — Telephone Encounter (Signed)
She is due for her annual exam

## 2016-07-07 ENCOUNTER — Encounter: Payer: Self-pay | Admitting: Gynecology

## 2016-07-07 ENCOUNTER — Other Ambulatory Visit (HOSPITAL_BASED_OUTPATIENT_CLINIC_OR_DEPARTMENT_OTHER): Payer: Self-pay | Admitting: Family Medicine

## 2016-07-07 ENCOUNTER — Other Ambulatory Visit: Payer: Self-pay | Admitting: Gynecology

## 2016-07-07 DIAGNOSIS — Z1231 Encounter for screening mammogram for malignant neoplasm of breast: Secondary | ICD-10-CM

## 2016-07-21 ENCOUNTER — Ambulatory Visit (INDEPENDENT_AMBULATORY_CARE_PROVIDER_SITE_OTHER): Payer: Medicare Other | Admitting: Gynecology

## 2016-07-21 ENCOUNTER — Encounter: Payer: Self-pay | Admitting: Gynecology

## 2016-07-21 VITALS — BP 126/82 | Ht 61.0 in | Wt 158.0 lb

## 2016-07-21 DIAGNOSIS — N952 Postmenopausal atrophic vaginitis: Secondary | ICD-10-CM | POA: Diagnosis not present

## 2016-07-21 DIAGNOSIS — Z01419 Encounter for gynecological examination (general) (routine) without abnormal findings: Secondary | ICD-10-CM

## 2016-07-21 DIAGNOSIS — Z79890 Hormone replacement therapy (postmenopausal): Secondary | ICD-10-CM

## 2016-07-21 DIAGNOSIS — M8580 Other specified disorders of bone density and structure, unspecified site: Secondary | ICD-10-CM | POA: Diagnosis not present

## 2016-07-21 DIAGNOSIS — F419 Anxiety disorder, unspecified: Secondary | ICD-10-CM | POA: Diagnosis not present

## 2016-07-21 MED ORDER — DIAZEPAM 10 MG PO TABS: ORAL_TABLET | ORAL | 2 refills | Status: DC

## 2016-07-21 MED ORDER — HYDROCHLOROTHIAZIDE 12.5 MG PO CAPS: 12.5000 mg | ORAL_CAPSULE | Freq: Every morning | ORAL | 11 refills | Status: DC

## 2016-07-21 NOTE — Progress Notes (Signed)
Kathy [name] 1950-05-09 [id number]   History:    66 y.o.  for annual gyn exam with no complaints today. Patient has been using vaginal estrogen twice a week for vaginal atrophy and has done well. She also takes Valium 10 mg on a when necessary basis for anxiety and to help her sleep. Patient also last year was started on HCTZ 12.5 mg daily for elevated blood pressure and her PCP has left her on this dose and medication.Patient with no past history of abnormal Pap smears. Her last bone density study in  In 2016 demonstrated her lowest T score was -2.4 at the [facility].Patient had a colonoscopy in 2014 it is on a 5 year recall because of past history of benign polyps having been removed. Patient several years ago had a transvaginal hysterectomy secondary to menorrhagia.  Pwith no complaints today. She does take Valium 10 mg on a when necessary basis for anxiety or to help her sleep.ast medical history,surgical history, family history and social history were all reviewed and documented in the EPIC chart.  Gynecologic History No LMP recorded. Patient has had a hysterectomy. Contraception: status post hysterectomy Last Pap2012. Results were: normal Last mam2016. Results were: normal  Obstetric History OB History  Gravida Para Term Preterm AB Living  3 2     1 2   SAB TAB Ectopic Multiple Live Births               # Outcome Date GA Lbr Len/2nd Weight Sex Delivery Anes PTL Lv  3 AB           2 Para           1 Para                ROS: A ROS was performed and pertinent positives and negatives are included in the history.  GENERAL: No fevers or chills. HEENT: No change in vision, no earache, sore throat or sinus congestion. NECK: No pain or stiffness. CARDIOVASCULAR: No chest pain or pressure. No palpitations. PULMONARY: No shortness of breath, cough or wheeze. GASTROINTESTINAL: No abdominal pain, nausea, vomiting or diarrhea, melena or bright red blood per rectum. GENITOURINARY: No urinary  frequency, urgency, hesitancy or dysuria. MUSCULOSKELETAL: No joint or muscle pain, no back pain, no recent trauma. DERMATOLOGIC: No rash, no itching, no lesions. ENDOCRINE: No polyuria, polydipsia, no heat or cold intolerance. No recent change in weight. HEMATOLOGICAL: No anemia or easy bruising or bleeding. NEUROLOGIC: No headache, seizures, numbness, tingling or weakness. PSYCHIATRIC: No depression, no loss of interest in normal activity or change in sleep pattern.     Exam: chaperone present  BP 126/82   Ht 5\' 1"  (1.549 m)   Wt 158 lb (71.7 kg)   BMI 29.85 kg/m   Body mass index is 29.85 kg/m.  General appearance : Well developed well nourished female. No acute distress HEENT: Eyes: no retinal hemorrhage or exudates,  Neck supple, trachea midline, no carotid bruits, no thyroidmegaly Lungs: Clear to auscultation, no rhonchi or wheezes, or rib retractions  Heart: Regular rate and rhythm, no murmurs or gallops Breast:Examined in sitting and supine position were symmetrical in appearance, no palpable masses or tenderness,  no skin retraction, no nipple inversion, no nipple discharge, no skin discoloration, no axillary or supraclavicular lymphadenopathy Abdomen: no palpable masses or tenderness, no rebound or guarding Extremities: no edema or skin discoloration or tenderness  Pelvic:  Bartholin, Urethra, Skene Glands: Within normal limits  Vagina: No gross lesions or discharge, atrophic changes   cervix: Absent               uterus: Absentnus and perineum  normal   Rectovaginal  normal sphincter tone without palpated masses or tenderness             HemPCP provides     Assessment/Plan:  66 y.o. female for annual exascheduled for mammogram in June. She also needs to schedule her bone density study. Patient with past history of osteopenia. Pap smear no longer indicated. We discussed importance of calcium vitamin D and weightbearing exercises for osteoporosis prevention.  Patient's PCP has been doing her blood work.   [name] MD, 11:42 AM 07/21/2016

## 2016-07-21 NOTE — Patient Instructions (Signed)
Bone Densitometry Bone densitometry is an imaging test that uses a special X-ray to measure the amount of calcium and other minerals in your bones (bone density). This test is also known as a bone mineral density test or dual-energy X-ray absorptiometry (DXA). The test can measure bone density at your hip and your spine. It is similar to having a regular X-ray. You may have this test to:  Diagnose a condition that causes weak or thin bones (osteoporosis).  Predict your risk of a broken bone (fracture).  Determine how well osteoporosis treatment is working. Tell a health care provider about:  Any allergies you have.  All medicines you are taking, including vitamins, herbs, eye drops, creams, and over-the-counter medicines.  Any problems you or family members have had with anesthetic medicines.  Any blood disorders you have.  Any surgeries you have had.  Any medical conditions you have.  Possibility of pregnancy.  Any other medical test you had within the previous 14 days that used contrast material. What are the risks? Generally, this is a safe procedure. However, problems can occur and may include the following:  This test exposes you to a very small amount of radiation.  The risks of radiation exposure may be greater to unborn children. What happens before the procedure?  Do not take any calcium supplements for 24 hours before having the test. You can otherwise eat and drink what you usually do.  Take off all metal jewelry, eyeglasses, dental appliances, and any other metal objects. What happens during the procedure?  You may lie on an exam table. There will be an X-ray generator below you and an imaging device above you.  Other devices, such as boxes or braces, may be used to position your body properly for the scan.  You will need to lie still while the machine slowly scans your body.  The images will show up on a computer monitor. What happens after the  procedure? You may need more testing at a later time. This information is not intended to replace advice given to you by your health care provider. Make sure you discuss any questions you have with your health care provider. Document Released: 03/02/2004 Document Revised: 07/17/2015 Document Reviewed: 07/19/2013 Elsevier Interactive Patient Education  2017 Elsevier Inc.  

## 2016-07-22 ENCOUNTER — Encounter: Payer: Medicare Other | Admitting: Gynecology

## 2016-08-03 ENCOUNTER — Other Ambulatory Visit: Payer: Self-pay | Admitting: Gynecology

## 2016-08-04 ENCOUNTER — Other Ambulatory Visit: Payer: Self-pay

## 2016-08-04 NOTE — Telephone Encounter (Signed)
Rx was printed at visit on 07/21/16 and patient did not receive it.  I gave pharmacist the 07/21/16 Rx today.

## 2016-08-16 ENCOUNTER — Other Ambulatory Visit: Payer: Self-pay | Admitting: Gynecology

## 2016-08-16 ENCOUNTER — Ambulatory Visit (HOSPITAL_BASED_OUTPATIENT_CLINIC_OR_DEPARTMENT_OTHER)
Admission: RE | Admit: 2016-08-16 | Discharge: 2016-08-16 | Disposition: A | Discharge status: Home / Self Care | Payer: Medicare Other | Source: Ambulatory Visit | Attending: Gynecology | Admitting: Gynecology

## 2016-08-16 ENCOUNTER — Ambulatory Visit (HOSPITAL_BASED_OUTPATIENT_CLINIC_OR_DEPARTMENT_OTHER)
Admission: RE | Admit: 2016-08-16 | Discharge: 2016-08-16 | Disposition: A | Discharge status: Home / Self Care | Payer: Medicare Other | Source: Ambulatory Visit | Attending: Family Medicine | Admitting: Family Medicine

## 2016-08-16 DIAGNOSIS — M8588 Other specified disorders of bone density and structure, other site: Secondary | ICD-10-CM | POA: Insufficient documentation

## 2016-08-16 DIAGNOSIS — M8580 Other specified disorders of bone density and structure, unspecified site: Secondary | ICD-10-CM

## 2016-08-16 DIAGNOSIS — Z9882 Breast implant status: Secondary | ICD-10-CM | POA: Insufficient documentation

## 2016-08-16 DIAGNOSIS — Z780 Asymptomatic menopausal state: Secondary | ICD-10-CM | POA: Insufficient documentation

## 2016-08-16 DIAGNOSIS — Z13820 Encounter for screening for osteoporosis: Secondary | ICD-10-CM | POA: Diagnosis present

## 2016-08-16 DIAGNOSIS — Z1231 Encounter for screening mammogram for malignant neoplasm of breast: Secondary | ICD-10-CM | POA: Diagnosis present

## 2016-12-31 ENCOUNTER — Telehealth: Payer: Self-pay

## 2016-12-31 NOTE — Telephone Encounter (Signed)
Pharmacy called because they have her Diazepan 10 mg prescription on file and Dr. Moshe Salisbury has retired. They need another physician's name to put on the Rx to be able to fill it for her. I provided the pharmacy with Dr. Dorette Grate name and DEA #.

## 2017-07-14 ENCOUNTER — Other Ambulatory Visit: Payer: Self-pay | Admitting: Obstetrics & Gynecology

## 2017-07-14 DIAGNOSIS — Z1231 Encounter for screening mammogram for malignant neoplasm of breast: Secondary | ICD-10-CM

## 2017-07-28 ENCOUNTER — Encounter: Payer: Medicare Other | Admitting: Obstetrics & Gynecology

## 2017-08-10 ENCOUNTER — Other Ambulatory Visit: Payer: Self-pay

## 2017-08-10 ENCOUNTER — Other Ambulatory Visit: Payer: Self-pay | Admitting: Obstetrics & Gynecology

## 2017-08-10 MED ORDER — HYDROCHLOROTHIAZIDE 12.5 MG PO CAPS: 12.5000 mg | ORAL_CAPSULE | Freq: Every morning | ORAL | 0 refills | Status: DC

## 2017-08-10 NOTE — Telephone Encounter (Signed)
Please make sure patient schedules with a Fam MD to manage her cHTN.  Can send prescription x 1 until that Fam MD visit.

## 2017-08-10 NOTE — Telephone Encounter (Signed)
I spoke with patient and informed her that Dr. Moshe Salisbury retired and Dr. Marguerita Merles does not treat hypertension. Advised that Dr. Marguerita Merles will refill one time to allow her time to see her PCP so that he/she can prescribe and follow BP for her. Patient agrees.

## 2017-08-10 NOTE — Telephone Encounter (Signed)
Former [facility] patient.  She has CE scheduled with you on 08/29/17.  At 07/14/2015 CE visit he wrote "Because of her blood pressure she's going to be started on HCTZ 12.5 mg daily have asked her to keep a long of her blood pressure readings twice a day and to take with her when she sees her PCP next week so that he can see the readings. Since this is a diuretic I have encouraged her also take fruit during the day to maintain her potassium low normal."  At her 07/21/16 visit Dr. Moshe Salisbury wrote "Patient also last year was started on HCTZ 12.5 mg daily for elevated blood pressure and her PCP has left her on this dose and medication"

## 2017-08-17 ENCOUNTER — Ambulatory Visit (HOSPITAL_BASED_OUTPATIENT_CLINIC_OR_DEPARTMENT_OTHER)
Admission: RE | Admit: 2017-08-17 | Discharge: 2017-08-17 | Disposition: A | Discharge status: Home / Self Care | Payer: Medicare Other | Source: Ambulatory Visit | Attending: Obstetrics & Gynecology | Admitting: Obstetrics & Gynecology

## 2017-08-17 DIAGNOSIS — Z1231 Encounter for screening mammogram for malignant neoplasm of breast: Secondary | ICD-10-CM | POA: Diagnosis present

## 2017-08-29 ENCOUNTER — Ambulatory Visit: Payer: Medicare Other | Admitting: Obstetrics & Gynecology

## 2017-08-29 ENCOUNTER — Encounter: Payer: Self-pay | Admitting: Obstetrics & Gynecology

## 2017-08-29 VITALS — BP 130/84 | Ht 60.75 in | Wt 152.0 lb

## 2017-08-29 DIAGNOSIS — Z90710 Acquired absence of both cervix and uterus: Secondary | ICD-10-CM

## 2017-08-29 DIAGNOSIS — Z780 Asymptomatic menopausal state: Secondary | ICD-10-CM

## 2017-08-29 DIAGNOSIS — F419 Anxiety disorder, unspecified: Secondary | ICD-10-CM | POA: Diagnosis not present

## 2017-08-29 DIAGNOSIS — Z01419 Encounter for gynecological examination (general) (routine) without abnormal findings: Secondary | ICD-10-CM | POA: Diagnosis not present

## 2017-08-29 DIAGNOSIS — M8588 Other specified disorders of bone density and structure, other site: Secondary | ICD-10-CM | POA: Diagnosis not present

## 2017-08-29 MED ORDER — DIAZEPAM 10 MG PO TABS: ORAL_TABLET | ORAL | 2 refills | Status: DC

## 2017-08-29 NOTE — Progress Notes (Signed)
Kathy [name] 12-Feb-1951 [id number]   History:    67 y.o. G3P2A1L2 Married.  RP:  Established patient presenting for annual gyn exam   HPI: S/P Vaginal Hysterectomy.  Menopause, well on no hormone replacement therapy.  No pelvic pain.  Normal vaginal secretions.  Not using the estradiol cream anymore.  Abstinent.  Urine and bowel movements normal.  Breasts normal.  Body mass index 28.96.  Health labs with family physician.  Past medical history,surgical history, family history and social history were all reviewed and documented in the EPIC chart.  Gynecologic History No LMP recorded. Patient has had a hysterectomy. Contraception: status post hysterectomy Last Pap: 05/2010. Results were: Negative Last mammogram: 07/2017. Results were: Benign Bone Density: 07/2016 Osteopenia with T-Score -1.1 Colonoscopy: 2014 benign polyps, needs to schedule this year.  Obstetric History OB History  Gravida Para Term Preterm AB Living  3 2     1 2   SAB TAB Ectopic Multiple Live Births               # Outcome Date GA Lbr Len/2nd Weight Sex Delivery Anes PTL Lv  3 AB           2 Para           1 Para              ROS: A ROS was performed and pertinent positives and negatives are included in the history.  GENERAL: No fevers or chills. HEENT: No change in vision, no earache, sore throat or sinus congestion. NECK: No pain or stiffness. CARDIOVASCULAR: No chest pain or pressure. No palpitations. PULMONARY: No shortness of breath, cough or wheeze. GASTROINTESTINAL: No abdominal pain, nausea, vomiting or diarrhea, melena or bright red blood per rectum. GENITOURINARY: No urinary frequency, urgency, hesitancy or dysuria. MUSCULOSKELETAL: No joint or muscle pain, no back pain, no recent trauma. DERMATOLOGIC: No rash, no itching, no lesions. ENDOCRINE: No polyuria, polydipsia, no heat or cold intolerance. No recent change in weight. HEMATOLOGICAL: No anemia or easy bruising or bleeding. NEUROLOGIC: No  headache, seizures, numbness, tingling or weakness. PSYCHIATRIC: No depression, no loss of interest in normal activity or change in sleep pattern.     Exam:   Ht 5' 0.75" (1.543 m)   Wt 152 lb (68.9 kg)   BMI 28.96 kg/m   Body mass index is 28.96 kg/m.  BP 130/84  General appearance : Well developed well nourished female. No acute distress HEENT: Eyes: no retinal hemorrhage or exudates,  Neck supple, trachea midline, no carotid bruits, no thyroidmegaly Lungs: Clear to auscultation, no rhonchi or wheezes, or rib retractions  Heart: Regular rate and rhythm, no murmurs or gallops Breast:Examined in sitting and supine position were symmetrical in appearance, no palpable masses or tenderness,  no skin retraction, no nipple inversion, no nipple discharge, no skin discoloration, no axillary or supraclavicular lymphadenopathy Abdomen: no palpable masses or tenderness, no rebound or guarding Extremities: no edema or skin discoloration or tenderness  Pelvic: Vulva: Normal             Vagina: No gross lesions or discharge  Cervix/Uterus absent  Adnexa  Without masses or tenderness  Anus: Normal   Assessment/Plan:  67 y.o. female for annual exam   1. Well female exam with routine gynecological exam Gynecologic exam status post total hysterectomy and menopause.  Last Pap test normal in 2012, no indication to repeat at this time.  Breast exam normal.  Last screening mammogram June 2019 was  benign.  Colonoscopy to organize this year.  Health labs with family physician.  2. Hx of total hysterectomy  3. Menopause present Well on no hormone replacement therapy.  4. Osteopenia of lumbar spine Osteopenia with the lowest T score at -1.1 at the spine.  Vitamin D supplement, calcium rich nutrition and weightbearing physical activity recommended.  5. Anxiety Mild anxiety and insomnia using Valium occasionally.  Encouraged to increase physical activity to release endorphins and use natural  products to favor sleep such as Chamomile herb tea.  Valium represcribed as needed.  Other orders - diazepam (VALIUM) 10 MG tablet; take 1 tablet by mouth every 6 hours if needed for anxiety  Counseling on above issues and coordination of care more than 50% for 10 minutes.  Princess Bruins MD, 4:25 PM 08/29/2017

## 2017-08-30 ENCOUNTER — Encounter: Payer: Self-pay | Admitting: *Deleted

## 2017-08-30 NOTE — Progress Notes (Signed)
Rx fro Valium 10mg  #30 2 refills given at OV yesterday called in to [facility]. KW CMA

## 2017-08-31 ENCOUNTER — Encounter: Payer: Self-pay | Admitting: Obstetrics & Gynecology

## 2017-08-31 NOTE — Patient Instructions (Addendum)
1. Well female exam with routine gynecological exam Gynecologic exam status post total hysterectomy and menopause.  Last Pap test normal in 2012, no indication to repeat at this time.  Breast exam normal.  Last screening mammogram June 2019 was  benign.  Colonoscopy to organize this year.  Health labs with family physician.  2. Hx of total hysterectomy  3. Menopause present Well on no hormone replacement therapy.  4. Osteopenia of lumbar spine Osteopenia with the lowest T score at -1.1 at the spine.  Vitamin D supplement, calcium rich nutrition and weightbearing physical activity recommended.  5. Anxiety Mild anxiety and insomnia using Valium occasionally.  Encouraged to increase physical activity to release endorphins and use natural products to favor sleep such as Chamomile herb tea.  Valium represcribed as needed.  Other orders - diazepam (VALIUM) 10 MG tablet; take 1 tablet by mouth every 6 hours if needed for anxiety  Magdalen, it was a pleasure meeting you today!

## 2017-09-07 ENCOUNTER — Other Ambulatory Visit: Payer: Self-pay | Admitting: Obstetrics & Gynecology

## 2018-04-25 ENCOUNTER — Other Ambulatory Visit: Payer: Self-pay | Admitting: Obstetrics & Gynecology

## 2018-04-25 NOTE — Telephone Encounter (Signed)
Rx called in 

## 2018-07-05 ENCOUNTER — Other Ambulatory Visit (HOSPITAL_BASED_OUTPATIENT_CLINIC_OR_DEPARTMENT_OTHER): Payer: Self-pay | Admitting: Obstetrics & Gynecology

## 2018-07-05 DIAGNOSIS — Z1231 Encounter for screening mammogram for malignant neoplasm of breast: Secondary | ICD-10-CM

## 2018-08-03 ENCOUNTER — Encounter: Payer: Self-pay | Admitting: Physician Assistant

## 2018-08-03 ENCOUNTER — Telehealth: Payer: Self-pay | Admitting: *Deleted

## 2018-08-03 ENCOUNTER — Other Ambulatory Visit: Payer: Self-pay | Admitting: *Deleted

## 2018-08-03 DIAGNOSIS — R002 Palpitations: Secondary | ICD-10-CM

## 2018-08-03 NOTE — Telephone Encounter (Signed)
3 day ZIO XT long term holter monitor to be mailed to patients home.  Dr. Curly Rim requires you to wear at least 24 hours, but you may wear up to 3 days to capture your symptoms.  Instructions reviewed briefly as they are included in the monitor kit.

## 2018-08-09 ENCOUNTER — Ambulatory Visit (INDEPENDENT_AMBULATORY_CARE_PROVIDER_SITE_OTHER): Payer: Medicare Other

## 2018-08-09 DIAGNOSIS — R002 Palpitations: Secondary | ICD-10-CM

## 2018-08-18 ENCOUNTER — Telehealth: Payer: Self-pay

## 2018-08-18 ENCOUNTER — Other Ambulatory Visit: Payer: Self-pay

## 2018-08-18 NOTE — Telephone Encounter (Signed)
Received message from a monitor technician to review an abnormal monitor. Had Dr. Rayann Heman (DOD) review and he stated that it showed afib. He had the patient scheduled in afib clinic on 08/23/18 with Clint Fenton PA-C at 2:30 pm.   Spoke with the patient, she expressed understanding and had no further questions.

## 2018-08-21 ENCOUNTER — Other Ambulatory Visit: Payer: Self-pay

## 2018-08-21 ENCOUNTER — Ambulatory Visit (HOSPITAL_BASED_OUTPATIENT_CLINIC_OR_DEPARTMENT_OTHER)
Admission: RE | Admit: 2018-08-21 | Discharge: 2018-08-21 | Disposition: A | Discharge status: Home / Self Care | Payer: Medicare Other | Source: Ambulatory Visit | Attending: Obstetrics & Gynecology | Admitting: Obstetrics & Gynecology

## 2018-08-21 ENCOUNTER — Telehealth: Payer: Self-pay | Admitting: *Deleted

## 2018-08-21 DIAGNOSIS — Z1231 Encounter for screening mammogram for malignant neoplasm of breast: Secondary | ICD-10-CM | POA: Insufficient documentation

## 2018-08-21 NOTE — Telephone Encounter (Signed)
Received call from [place] at [facility].  She was calling with final report on pts monitor.  Pt wore monitor from 6/16-21.  Pt had 3% burden of Afib and 6 runs of SVT.  Longest run of SVT was 24.1 secs.  Max HR was 187, average 118.  Raquel Sarna states monitor is loaded into system.  Pt currently scheduled with Afib clinic for f/u on Afib episode from last week.

## 2018-08-23 ENCOUNTER — Encounter (HOSPITAL_COMMUNITY): Payer: Self-pay | Admitting: Physician Assistant

## 2018-08-23 ENCOUNTER — Other Ambulatory Visit: Payer: Self-pay

## 2018-08-23 ENCOUNTER — Ambulatory Visit (HOSPITAL_COMMUNITY)
Admission: RE | Admit: 2018-08-23 | Discharge: 2018-08-23 | Disposition: A | Discharge status: Home / Self Care | Payer: Medicare Other | Source: Ambulatory Visit | Attending: Physician Assistant | Admitting: Physician Assistant

## 2018-08-23 VITALS — BP 138/82 | HR 77 | Ht 60.75 in | Wt 152.0 lb

## 2018-08-23 DIAGNOSIS — E7800 Pure hypercholesterolemia, unspecified: Secondary | ICD-10-CM | POA: Diagnosis not present

## 2018-08-23 DIAGNOSIS — Z8249 Family history of ischemic heart disease and other diseases of the circulatory system: Secondary | ICD-10-CM | POA: Insufficient documentation

## 2018-08-23 DIAGNOSIS — Z87442 Personal history of urinary calculi: Secondary | ICD-10-CM | POA: Insufficient documentation

## 2018-08-23 DIAGNOSIS — F17210 Nicotine dependence, cigarettes, uncomplicated: Secondary | ICD-10-CM | POA: Insufficient documentation

## 2018-08-23 DIAGNOSIS — I10 Essential (primary) hypertension: Secondary | ICD-10-CM | POA: Diagnosis not present

## 2018-08-23 DIAGNOSIS — Z79899 Other long term (current) drug therapy: Secondary | ICD-10-CM | POA: Diagnosis not present

## 2018-08-23 DIAGNOSIS — Z96641 Presence of right artificial hip joint: Secondary | ICD-10-CM | POA: Diagnosis not present

## 2018-08-23 DIAGNOSIS — Z885 Allergy status to narcotic agent status: Secondary | ICD-10-CM | POA: Insufficient documentation

## 2018-08-23 DIAGNOSIS — I480 Paroxysmal atrial fibrillation: Secondary | ICD-10-CM | POA: Diagnosis not present

## 2018-08-23 HISTORY — DX: Essential (primary) hypertension: I10

## 2018-08-23 MED ORDER — METOPROLOL SUCCINATE ER 25 MG PO TB24: 25.0000 mg | ORAL_TABLET | Freq: Every day | ORAL | 6 refills | Status: DC

## 2018-08-23 MED ORDER — APIXABAN 5 MG PO TABS: 5.0000 mg | ORAL_TABLET | Freq: Two times a day (BID) | ORAL | 0 refills | Status: DC

## 2018-08-23 MED ORDER — METOPROLOL SUCCINATE ER 25 MG PO TB24: 25.0000 mg | ORAL_TABLET | Freq: Every day | ORAL | 2 refills | Status: DC

## 2018-08-23 NOTE — Progress Notes (Addendum)
Primary Care Physician: Orpah Melter, MD Primary Cardiologist: none Primary Electrophysiologist: Dr Rayann Heman Referring Physician: Dr Rayann Heman   Kathy [name] is a 68 y.o. female with a history of HTN, tobacco abuse, and new onset paroxysmal atrial fibrillation who presents for consultation in the [facility].  The patient was initially diagnosed with atrial fibrillation on 08/18/18 after presenting with symptoms of palpitations and wearing a 3 day heart monitor which showed 3% AF burden. Patient reports that these palpitations had been going on for about one month prior to her diagnosis, about 2 episodes per week. She denies any specific triggers. She denies snoring or significant alcohol use.   Today, she denies symptoms of chest pain, shortness of breath, orthopnea, PND, lower extremity edema, dizziness, presyncope, syncope, snoring, daytime somnolence, bleeding, or neurologic sequela. The patient is tolerating medications without difficulties and is otherwise without complaint today.    Atrial Fibrillation Risk Factors:  she does not have symptoms or diagnosis of sleep apnea. she does not have a history of rheumatic fever. she does not have a history of alcohol use. The patient does not have a history of early familial atrial fibrillation or other arrhythmias.  she has a BMI of Body mass index is 28.96 kg/m.Marland Kitchen Filed Weights   08/23/18 1430  Weight: 68.9 kg    Family History  Problem Relation Age of Onset  . Hypertension Mother   . Heart disease Mother   . Heart disease Father   . Colon cancer Neg Hx   . Esophageal cancer Neg Hx   . Rectal cancer Neg Hx   . Stomach cancer Neg Hx      Atrial Fibrillation Management history:  Previous antiarrhythmic drugs: none Previous cardioversions: none  Previous ablations: none CHADS2VASC score: 2 Anticoagulation history: none   Past Medical History:  Diagnosis Date  . Cystocele   . Elevated  cholesterol   . Fibroid   . Hypertension   . Kidney stone    passed stone  . Osteopenia   . Perimenopausal vasomotor symptoms   . Rectocele   . SVD (spontaneous vaginal delivery)    x 2   Past Surgical History:  Procedure Laterality Date  . [place] SURGERY  2005  . AUGMENTATION MAMMAPLASTY     SALINE in 20's  . BREAST SURGERY     augmentation in 20's  . COLONOSCOPY    . RECTOCELE REPAIR N/A 10/31/2013   Procedure: POSTERIOR REPAIR (RECTOCELE);  Surgeon: [name], MD;  Location: [facility];  Service: Gynecology;  Laterality: N/A;  . right arm surgery     tendon repair  . TONSILLECTOMY    . TOTAL HIP ARTHROPLASTY  2001   RIGHT  . TUBAL LIGATION    . VAGINAL HYSTERECTOMY  1992  . WISDOM TOOTH EXTRACTION      Current Outpatient Medications  Medication Sig Dispense Refill  . b complex vitamins tablet Take 1 tablet by mouth daily.    . Calcium Carbonate-Vitamin D (CALCIUM + D PO) Take 1 tablet by mouth daily.     . cholecalciferol (VITAMIN D) 1000 UNITS tablet Take 1,000 Units by mouth daily.     . Coenzyme Q10 (COQ10 PO) Take 1 capsule by mouth daily.     . diazepam (VALIUM) 10 MG tablet TAKE 1 TABLET BY MOUTH EVERY 6 HOURS AS NEEDED FOR ANXIETY 30 tablet 1  . hydrochlorothiazide (MICROZIDE) 12.5 MG capsule Take 1 capsule (12.5 mg total) by mouth every morning.  30 capsule 0  . Omega-3 Fatty Acids (FISH OIL PO) Take 1 capsule by mouth daily.     . simvastatin (ZOCOR) 40 MG tablet Take 20 mg by mouth daily. Takes 1/2 of 40mg  tablet    . vitamin E (VITAMIN E) 1000 UNIT capsule Take 2,000 Units by mouth daily.      Marland Kitchen apixaban (ELIQUIS) 5 MG TABS tablet Take 1 tablet (5 mg total) by mouth 2 (two) times daily. 60 tablet 0  . metoprolol succinate (TOPROL XL) 25 MG 24 hr tablet Take 1 tablet (25 mg total) by mouth daily. 90 tablet 2   No current facility-administered medications for this encounter.     Allergies  Allergen Reactions  . Codeine Nausea And Vomiting    Social  History   Socioeconomic History  . Marital status: Married    Spouse name: Not on file  . Number of children: Not on file  . Years of education: Not on file  . Highest education level: Not on file  Occupational History  . Not on file  Social Needs  . Financial resource strain: Not on file  . Food insecurity    Worry: Not on file    Inability: Not on file  . Transportation needs    Medical: Not on file    Non-medical: Not on file  Tobacco Use  . Smoking status: Current Some Day Smoker    Packs/day: 0.10    Types: Cigarettes  . Smokeless tobacco: Never Used  Substance and Sexual Activity  . Alcohol use: Yes    Alcohol/week: 2.0 standard drinks    Types: 2 Glasses of wine per week    Comment: red wine x 4-5 nights per week  . Drug use: No  . Sexual activity: Not Currently    Birth control/protection: Surgical    Comment: 1st intercourse- 65, partners-20,  married- 27 yrs   Lifestyle  . Physical activity    Days per week: Not on file    Minutes per session: Not on file  . Stress: Not on file  Relationships  . Social Herbalist on phone: Not on file    Gets together: Not on file    Attends religious service: Not on file    Active member of club or organization: Not on file    Attends meetings of clubs or organizations: Not on file    Relationship status: Not on file  . Intimate partner violence    Fear of current or ex partner: Not on file    Emotionally abused: Not on file    Physically abused: Not on file    Forced sexual activity: Not on file  Other Topics Concern  . Not on file  Social History Narrative  . Not on file     ROS- All systems are reviewed and negative except as per the HPI above.  Physical Exam: Vitals:   08/23/18 1430  BP: 138/82  Pulse: 77  Weight: 68.9 kg  Height: 5' 0.75" (1.543 m)    GEN- The patient is well appearing, alert and oriented x 3 today.   Head- normocephalic, atraumatic Eyes-  Sclera clear, conjunctiva pink  Ears- hearing intact Oropharynx- clear Neck- supple  Lungs- Clear to ausculation bilaterally, normal work of breathing Heart- Regular rate and rhythm, no murmurs, rubs or gallops  GI- soft, NT, ND, + BS Extremities- no clubbing, cyanosis, or edema MS- no significant deformity or atrophy Skin- no rash or lesion Psych- euthymic  mood, full affect Neuro- strength and sensation are intact  Wt Readings from Last 3 Encounters:  08/23/18 68.9 kg  08/29/17 68.9 kg  07/21/16 71.7 kg    EKG today demonstrates SR HR 77, LAFB, slow R wave prog, PR 182, QRS 82, QTc 452  Labs at PCP 08/03/18 Cr 0.48, BUN 8, K+4.5 Hgb 14.1 TSH 0.71  Zio patch 08/18/18 Predominant rhythm is sinus rhythm Atrial Fibrillation is observed (3% burden) which likely explains palpitations with heart rates ranging from 110-216 bpm (avg of 161 bpm) The longest afib episode lated 2 hours 55 mins with an avg rate of 161 bpm.  Epic records are reviewed at length today  Assessment and Plan:  1. Paroxysmal atrial fibrillation The patient has new onset paroxysmal atrial fibrillation. General education about afib provided and questions answered. Will start Toprol 25 mg daily. May need to consider AAD if symptoms persist.  Check echocardiogram We discussed her stroke risk and the risks and benefits of anticoagulation. Start Eliquis 5 mg BID Encouraged lifestyle modification including regular physical activity and smoking cessation.   This patients CHA2DS2-VASc Score and unadjusted Ischemic Stroke Rate (% per year) is equal to 3.2 % stroke rate/year from a score of 3  Above score calculated as 1 point each if present [CHF, HTN, DM, Vascular=MI/PAD/Aortic Plaque, Age if 65-74, or Female] Above score calculated as 2 points each if present [Age > 75, or Stroke/TIA/TE]   2. HTN Stable, med changes as above.   Follow up in AF clinic in one month.   Addendum: Patient decided to pursue Coumadin for anticoagulation after  checking cost of Eliquis and Xarelto. Discussed the risks and benefits and precautions of coumadin. INR to be followed by PCP.  Patient has changed her mind and is going to start Eliquis instead of Coumadin.    [facility] [address] [place], [place] [address] [phone] 08/23/2018 4:20 PM

## 2018-08-23 NOTE — Patient Instructions (Signed)
Stop aspirin  Start Eliquis 5mg  twice a day   (check on the price of xarelto 20mg  once a day or eliquis 5mg  twice a day - let [name] know which one is covered by insurance best and I will call in the prescription to your pharmacy)  Start metoprolol 25mg  once a day

## 2018-08-24 NOTE — Addendum Note (Signed)
Encounter addended by: Oliver Barre, PA on: 08/24/2018 10:21 AM  Actions taken: Clinical Note Signed

## 2018-08-28 ENCOUNTER — Other Ambulatory Visit (HOSPITAL_COMMUNITY): Payer: Self-pay | Admitting: *Deleted

## 2018-08-28 MED ORDER — APIXABAN 5 MG PO TABS: 5.0000 mg | ORAL_TABLET | Freq: Two times a day (BID) | ORAL | 2 refills | Status: DC

## 2018-08-28 MED ORDER — APIXABAN 5 MG PO TABS: 5.0000 mg | ORAL_TABLET | Freq: Two times a day (BID) | ORAL | 0 refills | Status: DC

## 2018-08-28 NOTE — Addendum Note (Signed)
Encounter addended by: Oliver Barre, PA on: 08/28/2018 9:50 AM  Actions taken: Clinical Note Signed

## 2018-08-29 ENCOUNTER — Other Ambulatory Visit: Payer: Self-pay

## 2018-08-29 ENCOUNTER — Ambulatory Visit (HOSPITAL_COMMUNITY)
Admission: RE | Admit: 2018-08-29 | Discharge: 2018-08-29 | Disposition: A | Discharge status: Home / Self Care | Payer: Medicare Other | Source: Ambulatory Visit | Attending: Physician Assistant | Admitting: Physician Assistant

## 2018-08-29 DIAGNOSIS — F17200 Nicotine dependence, unspecified, uncomplicated: Secondary | ICD-10-CM | POA: Diagnosis not present

## 2018-08-29 DIAGNOSIS — E785 Hyperlipidemia, unspecified: Secondary | ICD-10-CM | POA: Insufficient documentation

## 2018-08-29 DIAGNOSIS — I480 Paroxysmal atrial fibrillation: Secondary | ICD-10-CM

## 2018-08-29 DIAGNOSIS — R002 Palpitations: Secondary | ICD-10-CM | POA: Insufficient documentation

## 2018-08-30 ENCOUNTER — Encounter (HOSPITAL_COMMUNITY): Payer: Self-pay | Admitting: *Deleted

## 2018-09-12 ENCOUNTER — Other Ambulatory Visit: Payer: Self-pay

## 2018-09-13 ENCOUNTER — Encounter: Payer: Self-pay | Admitting: Obstetrics & Gynecology

## 2018-09-13 ENCOUNTER — Ambulatory Visit (INDEPENDENT_AMBULATORY_CARE_PROVIDER_SITE_OTHER): Payer: Medicare Other | Admitting: Obstetrics & Gynecology

## 2018-09-13 VITALS — BP 120/70 | Ht 60.5 in | Wt 149.0 lb

## 2018-09-13 DIAGNOSIS — Z01419 Encounter for gynecological examination (general) (routine) without abnormal findings: Secondary | ICD-10-CM | POA: Diagnosis not present

## 2018-09-13 DIAGNOSIS — M8588 Other specified disorders of bone density and structure, other site: Secondary | ICD-10-CM

## 2018-09-13 DIAGNOSIS — Z780 Asymptomatic menopausal state: Secondary | ICD-10-CM | POA: Diagnosis not present

## 2018-09-13 DIAGNOSIS — F419 Anxiety disorder, unspecified: Secondary | ICD-10-CM | POA: Diagnosis not present

## 2018-09-13 DIAGNOSIS — Z1272 Encounter for screening for malignant neoplasm of vagina: Secondary | ICD-10-CM

## 2018-09-13 MED ORDER — DIAZEPAM 10 MG PO TABS: ORAL_TABLET | ORAL | 2 refills | Status: DC

## 2018-09-13 NOTE — Progress Notes (Signed)
Kathy [name] July 28, 1950 [id number]   History:    68 y.o. G3P2A1 Married  RP:  Established patient presenting for annual gyn exam   HPI: S/P Vaginal hysterectomy.  Post-menopause, well on no HRT.  No pelvic pain.  Rarely sexually active.  Urine/BMs normal.  Breasts normal with bilateral implants.  Investigated and treated for AFib. BMI 28.62.  Walking regularly. Colono 2014.  Health labs with Fam MD.    Past medical history,surgical history, family history and social history were all reviewed and documented in the EPIC chart.  Gynecologic History No LMP recorded. Patient has had a hysterectomy. Contraception: status post hysterectomy Last Pap: 05/2010. Results were: Negative Last mammogram: 07/2018. Results were: Negative Bone Density: 07/2016 Mild Osteopenia T-Score Spine -1.1.  Will repeat BD 07/2019. Colonoscopy: 2014  Obstetric History OB History  Gravida Para Term Preterm AB Living  3 2     1 2   SAB TAB Ectopic Multiple Live Births               # Outcome Date GA Lbr Len/2nd Weight Sex Delivery Anes PTL Lv  3 AB           2 Para           1 Para              ROS: A ROS was performed and pertinent positives and negatives are included in the history.  GENERAL: No fevers or chills. HEENT: No change in vision, no earache, sore throat or sinus congestion. NECK: No pain or stiffness. CARDIOVASCULAR: No chest pain or pressure. No palpitations. PULMONARY: No shortness of breath, cough or wheeze. GASTROINTESTINAL: No abdominal pain, nausea, vomiting or diarrhea, melena or bright red blood per rectum. GENITOURINARY: No urinary frequency, urgency, hesitancy or dysuria. MUSCULOSKELETAL: No joint or muscle pain, no back pain, no recent trauma. DERMATOLOGIC: No rash, no itching, no lesions. ENDOCRINE: No polyuria, polydipsia, no heat or cold intolerance. No recent change in weight. HEMATOLOGICAL: No anemia or easy bruising or bleeding. NEUROLOGIC: No headache, seizures, numbness, tingling  or weakness. PSYCHIATRIC: No depression, no loss of interest in normal activity or change in sleep pattern.     Exam:   BP 120/70   Ht 5' 0.5" (1.537 m)   Wt 149 lb (67.6 kg)   BMI 28.62 kg/m   Body mass index is 28.62 kg/m.  General appearance : Well developed well nourished female. No acute distress HEENT: Eyes: no retinal hemorrhage or exudates,  Neck supple, trachea midline, no carotid bruits, no thyroidmegaly Lungs: Clear to auscultation, no rhonchi or wheezes, or rib retractions  Heart: Regular rate and rhythm, no murmurs or gallops Breast:Examined in sitting and supine position were symmetrical in appearance, no palpable masses or tenderness,  no skin retraction, no nipple inversion, no nipple discharge, no skin discoloration, no axillary or supraclavicular lymphadenopathy Abdomen: no palpable masses or tenderness, no rebound or guarding Extremities: no edema or skin discoloration or tenderness  Pelvic: Vulva: Normal             Vagina: No gross lesions or discharge.  Pap reflex done  Cervix/Uterus absent  Adnexa  Without masses or tenderness  Anus: Normal   Assessment/Plan:  68 y.o. female for annual exam   1. Encounter for Papanicolaou smear of vagina as part of routine gynecological examination Gynecologic exam status post vaginal hysterectomy, and menopause.  Pap reflex done on the vaginal vault.  Breast exam with bilateral implants within normal.  Screening mammogram June 2020 was negative.  Colonoscopy in 2014.  Health labs with family physician.  Body mass index 28.62.  Recommend slightly lower calorie/carb diet.  Aerobic physical activities 5 times a week and weightlifting every 2 days.  2. Postmenopause Well on no hormone replacement therapy.  3. Osteopenia of lumbar spine Vitamin D supplements, calcium intake of 1200 mg daily and regular weightbearing physical activities.  We will repeat a bone density in June 2021. - DG Bone Density; Future  4. Anxiety  Recommend walking with deep breathing when having more anxiety.  Diazepam represcribed as needed.  Other orders - diazepam (VALIUM) 10 MG tablet; TAKE 1 TABLET BY MOUTH EVERY 6 HOURS AS NEEDED FOR ANXIETY  Princess Bruins MD, 9:43 AM 09/13/2018

## 2018-09-13 NOTE — Addendum Note (Signed)
Addended by: Thurnell Garbe A on: 09/13/2018 11:38 AM   Modules accepted: Orders

## 2018-09-13 NOTE — Patient Instructions (Signed)
1. Encounter for Papanicolaou smear of vagina as part of routine gynecological examination Gynecologic exam status post vaginal hysterectomy, and menopause.  Pap reflex done on the vaginal vault.  Breast exam with bilateral implants within normal.  Screening mammogram June 2020 was negative.  Colonoscopy in 2014.  Health labs with family physician.  Body mass index 28.62.  Recommend slightly lower calorie/carb diet.  Aerobic physical activities 5 times a week and weightlifting every 2 days.  2. Postmenopause Well on no hormone replacement therapy.  3. Osteopenia of lumbar spine Vitamin D supplements, calcium intake of 1200 mg daily and regular weightbearing physical activities.  We will repeat a bone density in June 2021. - DG Bone Density; Future  4. Anxiety Recommend walking with deep breathing when having more anxiety.  Diazepam represcribed as needed.  Other orders - diazepam (VALIUM) 10 MG tablet; TAKE 1 TABLET BY MOUTH EVERY 6 HOURS AS NEEDED FOR ANXIETY  Kathy [name], it was a pleasure seeing you today!  I will inform you of your results as soon as they are available.

## 2018-09-14 ENCOUNTER — Other Ambulatory Visit: Payer: Self-pay | Admitting: Obstetrics & Gynecology

## 2018-09-14 LAB — PAP IG W/ RFLX HPV ASCU

## 2018-09-28 ENCOUNTER — Other Ambulatory Visit: Payer: Self-pay

## 2018-09-28 ENCOUNTER — Encounter (HOSPITAL_COMMUNITY): Payer: Self-pay | Admitting: Physician Assistant

## 2018-09-28 ENCOUNTER — Ambulatory Visit (HOSPITAL_COMMUNITY)
Admission: RE | Admit: 2018-09-28 | Discharge: 2018-09-28 | Disposition: A | Discharge status: Home / Self Care | Payer: Medicare Other | Source: Ambulatory Visit | Attending: Physician Assistant | Admitting: Physician Assistant

## 2018-09-28 VITALS — BP 116/72 | HR 60 | Ht 60.5 in | Wt 150.0 lb

## 2018-09-28 DIAGNOSIS — Z96641 Presence of right artificial hip joint: Secondary | ICD-10-CM | POA: Insufficient documentation

## 2018-09-28 DIAGNOSIS — Z87442 Personal history of urinary calculi: Secondary | ICD-10-CM | POA: Insufficient documentation

## 2018-09-28 DIAGNOSIS — I10 Essential (primary) hypertension: Secondary | ICD-10-CM | POA: Insufficient documentation

## 2018-09-28 DIAGNOSIS — I480 Paroxysmal atrial fibrillation: Secondary | ICD-10-CM | POA: Diagnosis present

## 2018-09-28 DIAGNOSIS — Z7901 Long term (current) use of anticoagulants: Secondary | ICD-10-CM | POA: Insufficient documentation

## 2018-09-28 DIAGNOSIS — F17210 Nicotine dependence, cigarettes, uncomplicated: Secondary | ICD-10-CM | POA: Insufficient documentation

## 2018-09-28 DIAGNOSIS — Z79899 Other long term (current) drug therapy: Secondary | ICD-10-CM | POA: Insufficient documentation

## 2018-09-28 DIAGNOSIS — M8580 Other specified disorders of bone density and structure, unspecified site: Secondary | ICD-10-CM | POA: Insufficient documentation

## 2018-09-28 DIAGNOSIS — Z885 Allergy status to narcotic agent status: Secondary | ICD-10-CM | POA: Insufficient documentation

## 2018-09-28 DIAGNOSIS — Z8249 Family history of ischemic heart disease and other diseases of the circulatory system: Secondary | ICD-10-CM | POA: Diagnosis not present

## 2018-09-28 DIAGNOSIS — E7800 Pure hypercholesterolemia, unspecified: Secondary | ICD-10-CM | POA: Insufficient documentation

## 2018-09-28 LAB — CBC
HCT: 42 % (ref 36.0–46.0)
Hemoglobin: 14.1 g/dL (ref 12.0–15.0)
MCH: 31.4 pg (ref 26.0–34.0)
MCHC: 33.6 g/dL (ref 30.0–36.0)
MCV: 93.5 fL (ref 80.0–100.0)
Platelets: 279 10*3/uL (ref 150–400)
RBC: 4.49 MIL/uL (ref 3.87–5.11)
RDW: 14.3 % (ref 11.5–15.5)
WBC: 7.5 10*3/uL (ref 4.0–10.5)
nRBC: 0 % (ref 0.0–0.2)

## 2018-09-28 NOTE — Progress Notes (Signed)
Primary Care Physician: Lyman Bishop, DO Primary Cardiologist: none Primary Electrophysiologist: Dr Rayann Heman Referring Physician: Dr Rayann Heman   Kathy [name] is a 68 y.o. female with a history of HTN, tobacco abuse, and new onset paroxysmal atrial fibrillation who presents for follow up in the [facility].  The patient was initially diagnosed with atrial fibrillation on 08/18/18 after presenting with symptoms of palpitations and wearing a 3 day heart monitor which showed 3% AF burden. Patient reports that these palpitations had been going on for about one month prior to her diagnosis, about 2 episodes per week. She denies any specific triggers. She denies snoring or significant alcohol use.   On follow up today, patient reports that she has done very well since her last visit. She has had two brief episodes of palpitations that lasted 5-10 minutes each. No bleeding issues with Eliquis.   Today, she denies symptoms of chest pain, shortness of breath, orthopnea, PND, lower extremity edema, dizziness, presyncope, syncope, snoring, daytime somnolence, bleeding, or neurologic sequela. The patient is tolerating medications without difficulties and is otherwise without complaint today.    Atrial Fibrillation Risk Factors:  she does not have symptoms or diagnosis of sleep apnea. she does not have a history of rheumatic fever. she does not have a history of alcohol use. The patient does not have a history of early familial atrial fibrillation or other arrhythmias.  she has a BMI of Body mass index is 28.81 kg/m.Marland Kitchen Filed Weights   09/28/18 0924  Weight: 68 kg    Family History  Problem Relation Age of Onset  . Hypertension Mother   . Heart disease Mother   . Heart disease Father   . Colon cancer Neg Hx   . Esophageal cancer Neg Hx   . Rectal cancer Neg Hx   . Stomach cancer Neg Hx      Atrial Fibrillation Management history:  Previous antiarrhythmic  drugs: none Previous cardioversions: none  Previous ablations: none CHADS2VASC score: 3 Anticoagulation history: Eliquis   Past Medical History:  Diagnosis Date  . Cystocele   . Elevated cholesterol   . Fibroid   . Hypertension   . Kidney stone    passed stone  . Osteopenia   . Perimenopausal vasomotor symptoms   . Rectocele   . SVD (spontaneous vaginal delivery)    x 2   Past Surgical History:  Procedure Laterality Date  . [place] SURGERY  2005  . AUGMENTATION MAMMAPLASTY     SALINE in 20's  . BREAST SURGERY     augmentation in 20's  . COLONOSCOPY    . RECTOCELE REPAIR N/A 10/31/2013   Procedure: POSTERIOR REPAIR (RECTOCELE);  Surgeon: [name], MD;  Location: [facility];  Service: Gynecology;  Laterality: N/A;  . right arm surgery     tendon repair  . TONSILLECTOMY    . TOTAL HIP ARTHROPLASTY  2001   RIGHT  . TUBAL LIGATION    . VAGINAL HYSTERECTOMY  1992  . WISDOM TOOTH EXTRACTION      Current Outpatient Medications  Medication Sig Dispense Refill  . apixaban (ELIQUIS) 5 MG TABS tablet Take 1 tablet (5 mg total) by mouth 2 (two) times daily. 180 tablet 2  . b complex vitamins tablet Take 1 tablet by mouth daily.    . Calcium Carbonate-Vitamin D (CALCIUM + D PO) Take 1 tablet by mouth daily.     . cholecalciferol (VITAMIN D) 1000 UNITS tablet Take 1,000  Units by mouth daily.     . Coenzyme Q10 (COQ10 PO) Take 1 capsule by mouth daily.     . diazepam (VALIUM) 10 MG tablet TAKE 1 TABLET BY MOUTH EVERY 6 HOURS AS NEEDED FOR ANXIETY 30 tablet 2  . hydrochlorothiazide (MICROZIDE) 12.5 MG capsule Take 1 capsule (12.5 mg total) by mouth every morning. 30 capsule 0  . metoprolol succinate (TOPROL XL) 25 MG 24 hr tablet Take 1 tablet (25 mg total) by mouth daily. 90 tablet 2  . Omega-3 Fatty Acids (FISH OIL PO) Take 1 capsule by mouth daily.     . simvastatin (ZOCOR) 40 MG tablet Take 20 mg by mouth daily. Takes 1/2 of 40mg  tablet    . vitamin E (VITAMIN E) 1000 UNIT  capsule Take 2,000 Units by mouth daily.       No current facility-administered medications for this encounter.     Allergies  Allergen Reactions  . Codeine Nausea And Vomiting    Social History   Socioeconomic History  . Marital status: Married    Spouse name: Not on file  . Number of children: Not on file  . Years of education: Not on file  . Highest education level: Not on file  Occupational History  . Not on file  Social Needs  . Financial resource strain: Not on file  . Food insecurity    Worry: Not on file    Inability: Not on file  . Transportation needs    Medical: Not on file    Non-medical: Not on file  Tobacco Use  . Smoking status: Current Some Day Smoker    Packs/day: 0.10    Types: Cigarettes  . Smokeless tobacco: Never Used  Substance and Sexual Activity  . Alcohol use: Yes    Alcohol/week: 2.0 standard drinks    Types: 2 Glasses of wine per week    Comment: red wine x 4-5 nights per week  . Drug use: No  . Sexual activity: Not Currently    Birth control/protection: Surgical    Comment: 1st intercourse- 89, partners-20,  married- 25 yrs   Lifestyle  . Physical activity    Days per week: Not on file    Minutes per session: Not on file  . Stress: Not on file  Relationships  . Social Herbalist on phone: Not on file    Gets together: Not on file    Attends religious service: Not on file    Active member of club or organization: Not on file    Attends meetings of clubs or organizations: Not on file    Relationship status: Not on file  . Intimate partner violence    Fear of current or ex partner: Not on file    Emotionally abused: Not on file    Physically abused: Not on file    Forced sexual activity: Not on file  Other Topics Concern  . Not on file  Social History Narrative  . Not on file     ROS- All systems are reviewed and negative except as per the HPI above.  Physical Exam: Vitals:   09/28/18 0924  BP: 116/72  Pulse:  60  Weight: 68 kg  Height: 5' 0.5" (1.537 m)    GEN- The patient is well appearing, alert and oriented x 3 today.   HEENT-head normocephalic, atraumatic, sclera clear, conjunctiva pink, hearing intact, trachea midline. Lungs- Clear to ausculation bilaterally, normal work of breathing Heart- Regular rate and  rhythm, no murmurs, rubs or gallops  GI- soft, NT, ND, + BS Extremities- no clubbing, cyanosis, or edema MS- no significant deformity or atrophy Skin- no rash or lesion Psych- euthymic mood, full affect Neuro- strength and sensation are intact   Wt Readings from Last 3 Encounters:  09/28/18 68 kg  09/13/18 67.6 kg  08/23/18 68.9 kg    EKG today demonstrates SR HR 60, LAFB, slow R wave prog, PR 184, QRS 82, QTc 442   Zio patch 08/18/18 Predominant rhythm is sinus rhythm Atrial Fibrillation is observed (3% burden) which likely explains palpitations with heart rates ranging from 110-216 bpm (avg of 161 bpm) The longest afib episode lated 2 hours 55 mins with an avg rate of 161 bpm.   Echo 08/29/18 1. The left ventricle has normal systolic function, with an ejection fraction of 55-60%. The cavity size was normal. Left ventricular diastolic Doppler parameters are consistent with impaired relaxation. No evidence of left ventricular regional wall  motion abnormalities.  2. The right ventricle has normal systolic function. The cavity was normal. There is no increase in right ventricular wall thickness.  3. No evidence of mitral valve stenosis. Trivial mitral regurgitation.  4. The aortic valve is tricuspid. Aortic valve regurgitation is trivial by color flow Doppler. No stenosis of the aortic valve.  5. The aortic root is normal in size and structure.  6. Normal IVC size. No complete TR doppler jet so unable to estimate PA systolic pressure.   Epic records are reviewed at length today  Assessment and Plan: 1. Paroxysmal atrial fibrillation Patient appears to be maintaining SR.  Continue Toprol 25 mg daily. May consider AAD if symptoms become more persistent.  Continue Eliquis 5 mg BID. Check CBC today. Encouraged lifestyle modification including regular physical activity and smoking cessation.   This patients CHA2DS2-VASc Score and unadjusted Ischemic Stroke Rate (% per year) is equal to 3.2 % stroke rate/year from a score of 3  Above score calculated as 1 point each if present [CHF, HTN, DM, Vascular=MI/PAD/Aortic Plaque, Age if 65-74, or Female] Above score calculated as 2 points each if present [Age > 75, or Stroke/TIA/TE]   2. HTN Stable, no changes today.   Follow up in AF clinic in 3 months.    [facility] [address] [place], [place] [address] [phone] 09/28/2018 9:49 AM

## 2019-03-15 ENCOUNTER — Other Ambulatory Visit: Payer: Self-pay

## 2019-03-15 ENCOUNTER — Encounter (HOSPITAL_COMMUNITY): Payer: Self-pay | Admitting: Physician Assistant

## 2019-03-15 ENCOUNTER — Ambulatory Visit (HOSPITAL_COMMUNITY)
Admission: RE | Admit: 2019-03-15 | Discharge: 2019-03-15 | Disposition: A | Discharge status: Home / Self Care | Payer: Medicare Other | Source: Ambulatory Visit | Attending: Physician Assistant | Admitting: Physician Assistant

## 2019-03-15 VITALS — BP 132/76 | HR 68 | Ht 60.5 in | Wt 163.0 lb

## 2019-03-15 DIAGNOSIS — I4819 Other persistent atrial fibrillation: Secondary | ICD-10-CM | POA: Insufficient documentation

## 2019-03-15 DIAGNOSIS — D6869 Other thrombophilia: Secondary | ICD-10-CM

## 2019-03-15 DIAGNOSIS — F419 Anxiety disorder, unspecified: Secondary | ICD-10-CM | POA: Diagnosis not present

## 2019-03-15 DIAGNOSIS — Z9851 Tubal ligation status: Secondary | ICD-10-CM | POA: Diagnosis not present

## 2019-03-15 DIAGNOSIS — E669 Obesity, unspecified: Secondary | ICD-10-CM | POA: Diagnosis not present

## 2019-03-15 DIAGNOSIS — E7800 Pure hypercholesterolemia, unspecified: Secondary | ICD-10-CM | POA: Insufficient documentation

## 2019-03-15 DIAGNOSIS — Z885 Allergy status to narcotic agent status: Secondary | ICD-10-CM | POA: Diagnosis not present

## 2019-03-15 DIAGNOSIS — F17210 Nicotine dependence, cigarettes, uncomplicated: Secondary | ICD-10-CM | POA: Insufficient documentation

## 2019-03-15 DIAGNOSIS — Z90710 Acquired absence of both cervix and uterus: Secondary | ICD-10-CM | POA: Insufficient documentation

## 2019-03-15 DIAGNOSIS — Z79899 Other long term (current) drug therapy: Secondary | ICD-10-CM | POA: Insufficient documentation

## 2019-03-15 DIAGNOSIS — I444 Left anterior fascicular block: Secondary | ICD-10-CM | POA: Insufficient documentation

## 2019-03-15 DIAGNOSIS — Z7901 Long term (current) use of anticoagulants: Secondary | ICD-10-CM | POA: Diagnosis not present

## 2019-03-15 DIAGNOSIS — Z792 Long term (current) use of antibiotics: Secondary | ICD-10-CM | POA: Diagnosis not present

## 2019-03-15 DIAGNOSIS — Z6831 Body mass index (BMI) 31.0-31.9, adult: Secondary | ICD-10-CM | POA: Diagnosis not present

## 2019-03-15 DIAGNOSIS — M8580 Other specified disorders of bone density and structure, unspecified site: Secondary | ICD-10-CM | POA: Diagnosis not present

## 2019-03-15 DIAGNOSIS — R9431 Abnormal electrocardiogram [ECG] [EKG]: Secondary | ICD-10-CM | POA: Insufficient documentation

## 2019-03-15 DIAGNOSIS — Z96641 Presence of right artificial hip joint: Secondary | ICD-10-CM | POA: Insufficient documentation

## 2019-03-15 DIAGNOSIS — Z8249 Family history of ischemic heart disease and other diseases of the circulatory system: Secondary | ICD-10-CM | POA: Insufficient documentation

## 2019-03-15 DIAGNOSIS — I480 Paroxysmal atrial fibrillation: Secondary | ICD-10-CM | POA: Diagnosis not present

## 2019-03-15 DIAGNOSIS — I10 Essential (primary) hypertension: Secondary | ICD-10-CM | POA: Diagnosis not present

## 2019-03-15 NOTE — Patient Instructions (Signed)
Stop fish oil and vitamin E

## 2019-03-15 NOTE — Progress Notes (Signed)
Primary Care Physician: Lyman Bishop, DO Primary Cardiologist: none Primary Electrophysiologist: Dr Rayann Heman Referring Physician: Dr Rayann Heman   Kathy [name] is a 69 y.o. female with a history of HTN, tobacco abuse, and new onset paroxysmal atrial fibrillation who presents for follow up in the [facility].  The patient was initially diagnosed with atrial fibrillation on 08/18/18 after presenting with symptoms of palpitations and wearing a 3 day heart monitor which showed 3% AF burden. Patient reports that these palpitations had been going on for about one month prior to her diagnosis, about 2 episodes per week. She denies any specific triggers. She denies snoring or significant alcohol use. She is on Eliquis for a CHADS2VASC score of 3.  On follow up today, patient reports that she has done well since her last visit. She had three episodes of palpitations which lasted no more than 10 minutes each. There were no specific triggers that she could identify. She is tolerating the medication without difficulty.   Today, she denies symptoms of chest pain, shortness of breath, orthopnea, PND, lower extremity edema, dizziness, presyncope, syncope, snoring, daytime somnolence, bleeding, or neurologic sequela. The patient is tolerating medications without difficulties and is otherwise without complaint today.    Atrial Fibrillation Risk Factors:  she does not have symptoms or diagnosis of sleep apnea. she does not have a history of rheumatic fever. she does not have a history of alcohol use. The patient does not have a history of early familial atrial fibrillation or other arrhythmias.  she has a BMI of Body mass index is 31.31 kg/m.Marland Kitchen Filed Weights   03/15/19 1030  Weight: 73.9 kg    Family History  Problem Relation Age of Onset  . Hypertension Mother   . Heart disease Mother   . Heart disease Father   . Colon cancer Neg Hx   . Esophageal cancer Neg Hx   .  Rectal cancer Neg Hx   . Stomach cancer Neg Hx      Atrial Fibrillation Management history:  Previous antiarrhythmic drugs: none Previous cardioversions: none  Previous ablations: none CHADS2VASC score: 3 Anticoagulation history: Eliquis   Past Medical History:  Diagnosis Date  . Cystocele   . Elevated cholesterol   . Fibroid   . Hypertension   . Kidney stone    passed stone  . Osteopenia   . Perimenopausal vasomotor symptoms   . Rectocele   . SVD (spontaneous vaginal delivery)    x 2   Past Surgical History:  Procedure Laterality Date  . [place] SURGERY  2005  . AUGMENTATION MAMMAPLASTY     SALINE in 20's  . BREAST SURGERY     augmentation in 20's  . COLONOSCOPY    . RECTOCELE REPAIR N/A 10/31/2013   Procedure: POSTERIOR REPAIR (RECTOCELE);  Surgeon: [name], MD;  Location: [facility];  Service: Gynecology;  Laterality: N/A;  . right arm surgery     tendon repair  . TONSILLECTOMY    . TOTAL HIP ARTHROPLASTY  2001   RIGHT  . TUBAL LIGATION    . VAGINAL HYSTERECTOMY  1992  . WISDOM TOOTH EXTRACTION      Current Outpatient Medications  Medication Sig Dispense Refill  . amoxicillin (AMOXIL) 500 MG tablet TK 4 TS PO 1 HOUR PTA    . apixaban (ELIQUIS) 5 MG TABS tablet Take 1 tablet (5 mg total) by mouth 2 (two) times daily. 180 tablet 2  . b complex vitamins tablet  Take 1 tablet by mouth daily.    . Calcium Carbonate-Vitamin D (CALCIUM + D PO) Take 1 tablet by mouth daily.     . cholecalciferol (VITAMIN D) 1000 UNITS tablet Take 1,000 Units by mouth daily.     . Coenzyme Q10 (COQ10 PO) Take 1 capsule by mouth daily.     . diazepam (VALIUM) 10 MG tablet TAKE 1 TABLET BY MOUTH EVERY 6 HOURS AS NEEDED FOR ANXIETY 30 tablet 2  . hydrochlorothiazide (MICROZIDE) 12.5 MG capsule Take 1 capsule (12.5 mg total) by mouth every morning. 30 capsule 0  . metoprolol succinate (TOPROL XL) 25 MG 24 hr tablet Take 1 tablet (25 mg total) by mouth daily. 90 tablet 2  . sertraline  (ZOLOFT) 100 MG tablet Take 100 mg by mouth daily.    . simvastatin (ZOCOR) 40 MG tablet Take 20 mg by mouth daily. Takes 1/2 of 40mg  tablet     No current facility-administered medications for this encounter.    Allergies  Allergen Reactions  . Codeine Nausea And Vomiting    Social History   Socioeconomic History  . Marital status: Married    Spouse name: Not on file  . Number of children: Not on file  . Years of education: Not on file  . Highest education level: Not on file  Occupational History  . Not on file  Tobacco Use  . Smoking status: Current Some Day Smoker    Packs/day: 0.10    Types: Cigarettes  . Smokeless tobacco: Never Used  . Tobacco comment: pack a week  Substance and Sexual Activity  . Alcohol use: Yes    Alcohol/week: 2.0 standard drinks    Types: 2 Glasses of wine per week    Comment: red wine x 4-5 nights per week  . Drug use: No  . Sexual activity: Not Currently    Birth control/protection: Surgical    Comment: 1st intercourse- 71, partners-20,  married- 60 yrs   Other Topics Concern  . Not on file  Social History Narrative  . Not on file   Social Determinants of Health   Financial Resource Strain:   . Difficulty of Paying Living Expenses: Not on file  Food Insecurity:   . Worried About Charity fundraiser in the Last Year: Not on file  . Ran Out of Food in the Last Year: Not on file  Transportation Needs:   . Lack of Transportation (Medical): Not on file  . Lack of Transportation (Non-Medical): Not on file  Physical Activity:   . Days of Exercise per Week: Not on file  . Minutes of Exercise per Session: Not on file  Stress:   . Feeling of Stress : Not on file  Social Connections:   . Frequency of Communication with Friends and Family: Not on file  . Frequency of Social Gatherings with Friends and Family: Not on file  . Attends Religious Services: Not on file  . Active Member of Clubs or Organizations: Not on file  . Attends Theatre manager Meetings: Not on file  . Marital Status: Not on file  Intimate Partner Violence:   . Fear of Current or Ex-Partner: Not on file  . Emotionally Abused: Not on file  . Physically Abused: Not on file  . Sexually Abused: Not on file     ROS- All systems are reviewed and negative except as per the HPI above.  Physical Exam: Vitals:   03/15/19 1030  BP: 132/76  Pulse: 68  Weight: 73.9 kg  Height: 5' 0.5" (1.537 m)    GEN- The patient is well appearing obese female, alert and oriented x 3 today.   HEENT-head normocephalic, atraumatic, sclera clear, conjunctiva pink, hearing intact, trachea midline. Lungs- Clear to ausculation bilaterally, normal work of breathing Heart- Regular rate and rhythm, no murmurs, rubs or gallops  GI- soft, NT, ND, + BS Extremities- no clubbing, cyanosis, or edema MS- no significant deformity or atrophy Skin- no rash or lesion Psych- euthymic mood, full affect Neuro- strength and sensation are intact   Wt Readings from Last 3 Encounters:  03/15/19 73.9 kg  09/28/18 68 kg  09/13/18 67.6 kg    EKG today demonstrates SR HR 68, LAFB, PR 184, QRS 78, QTc 459   Zio patch 08/18/18 Predominant rhythm is sinus rhythm Atrial Fibrillation is observed (3% burden) which likely explains palpitations with heart rates ranging from 110-216 bpm (avg of 161 bpm) The longest afib episode lated 2 hours 55 mins with an avg rate of 161 bpm.   Echo 08/29/18 1. The left ventricle has normal systolic function, with an ejection fraction of 55-60%. The cavity size was normal. Left ventricular diastolic Doppler parameters are consistent with impaired relaxation. No evidence of left ventricular regional wall  motion abnormalities.  2. The right ventricle has normal systolic function. The cavity was normal. There is no increase in right ventricular wall thickness.  3. No evidence of mitral valve stenosis. Trivial mitral regurgitation.  4. The aortic valve is  tricuspid. Aortic valve regurgitation is trivial by color flow Doppler. No stenosis of the aortic valve.  5. The aortic root is normal in size and structure.  6. Normal IVC size. No complete TR doppler jet so unable to estimate PA systolic pressure.   Epic records are reviewed at length today  Assessment and Plan: 1. Paroxysmal atrial fibrillation Patient doing well with rare, brief palpitations. Continue Toprol 25 mg daily.  May consider AAD if symptoms become more persistent.  Continue Eliquis 5 mg BID. Lifestyle changes as below and including smoking cessation.   This patients CHA2DS2-VASc Score and unadjusted Ischemic Stroke Rate (% per year) is equal to 3.2 % stroke rate/year from a score of 3  Above score calculated as 1 point each if present [CHF, HTN, DM, Vascular=MI/PAD/Aortic Plaque, Age if 65-74, or Female] Above score calculated as 2 points each if present [Age > 75, or Stroke/TIA/TE]   2. HTN Stable, no changes today.  3. Obesity Body mass index is 31.31 kg/m. Lifestyle modification was discussed and encouraged including regular physical activity and weight reduction.    Follow up in the AF clinic in 4 months.    [facility] [address] [place], [place] [address] [phone] 03/15/2019 11:08 AM

## 2019-04-17 ENCOUNTER — Other Ambulatory Visit (HOSPITAL_BASED_OUTPATIENT_CLINIC_OR_DEPARTMENT_OTHER): Payer: Self-pay | Admitting: Family Medicine

## 2019-04-17 DIAGNOSIS — Z780 Asymptomatic menopausal state: Secondary | ICD-10-CM

## 2019-04-30 ENCOUNTER — Other Ambulatory Visit (HOSPITAL_COMMUNITY): Payer: Self-pay | Admitting: Physician Assistant

## 2019-04-30 ENCOUNTER — Other Ambulatory Visit (HOSPITAL_COMMUNITY): Payer: Self-pay | Admitting: Nurse Practitioner

## 2019-07-11 ENCOUNTER — Ambulatory Visit (HOSPITAL_COMMUNITY): Payer: Medicare Other | Admitting: Physician Assistant

## 2019-07-19 ENCOUNTER — Other Ambulatory Visit (HOSPITAL_BASED_OUTPATIENT_CLINIC_OR_DEPARTMENT_OTHER): Payer: Self-pay | Admitting: Family Medicine

## 2019-07-19 ENCOUNTER — Other Ambulatory Visit: Payer: Self-pay

## 2019-07-19 ENCOUNTER — Ambulatory Visit (HOSPITAL_COMMUNITY)
Admission: RE | Admit: 2019-07-19 | Discharge: 2019-07-19 | Disposition: A | Discharge status: Home / Self Care | Payer: Medicare Other | Source: Ambulatory Visit | Attending: Physician Assistant | Admitting: Physician Assistant

## 2019-07-19 ENCOUNTER — Encounter (HOSPITAL_COMMUNITY): Payer: Self-pay | Admitting: Physician Assistant

## 2019-07-19 VITALS — BP 124/80 | HR 78 | Ht 60.5 in | Wt 159.0 lb

## 2019-07-19 DIAGNOSIS — I480 Paroxysmal atrial fibrillation: Secondary | ICD-10-CM

## 2019-07-19 DIAGNOSIS — Z1231 Encounter for screening mammogram for malignant neoplasm of breast: Secondary | ICD-10-CM

## 2019-07-19 DIAGNOSIS — F17210 Nicotine dependence, cigarettes, uncomplicated: Secondary | ICD-10-CM | POA: Diagnosis not present

## 2019-07-19 DIAGNOSIS — Z79899 Other long term (current) drug therapy: Secondary | ICD-10-CM | POA: Insufficient documentation

## 2019-07-19 DIAGNOSIS — Z8249 Family history of ischemic heart disease and other diseases of the circulatory system: Secondary | ICD-10-CM | POA: Diagnosis not present

## 2019-07-19 DIAGNOSIS — D6869 Other thrombophilia: Secondary | ICD-10-CM

## 2019-07-19 DIAGNOSIS — Z885 Allergy status to narcotic agent status: Secondary | ICD-10-CM | POA: Insufficient documentation

## 2019-07-19 DIAGNOSIS — Z6830 Body mass index (BMI) 30.0-30.9, adult: Secondary | ICD-10-CM | POA: Insufficient documentation

## 2019-07-19 DIAGNOSIS — Z7901 Long term (current) use of anticoagulants: Secondary | ICD-10-CM | POA: Diagnosis not present

## 2019-07-19 DIAGNOSIS — I10 Essential (primary) hypertension: Secondary | ICD-10-CM | POA: Diagnosis not present

## 2019-07-19 DIAGNOSIS — E669 Obesity, unspecified: Secondary | ICD-10-CM | POA: Insufficient documentation

## 2019-07-19 NOTE — Progress Notes (Signed)
Primary Care Physician: Lyman Bishop, DO Primary Cardiologist: none Primary Electrophysiologist: none Referring Physician: Dr Clemmie Krill is a 69 y.o. female with a history of HTN, tobacco abuse, and new onset paroxysmal atrial fibrillation who presents for follow up in the [facility].  The patient was initially diagnosed with atrial fibrillation on 08/18/18 after presenting with symptoms of palpitations and wearing a 3 day heart monitor which showed 3% AF burden. Patient reports that these palpitations had been going on for about one month prior to her diagnosis, about 2 episodes per week. She denies any specific triggers. She denies snoring or significant alcohol use. She is on Eliquis for a CHADS2VASC score of 3.  On follow up today, patient reports that she has done very well since her last visit. She denies any heart racing or palpitations. She denies bleeding issues on anticoagulation. She has cut back her tobacco use from two packs per week to one pack per week.   Today, she denies symptoms of palpitations, chest pain, shortness of breath, orthopnea, PND, lower extremity edema, dizziness, presyncope, syncope, snoring, daytime somnolence, bleeding, or neurologic sequela. The patient is tolerating medications without difficulties and is otherwise without complaint today.    Atrial Fibrillation Risk Factors:  she does not have symptoms or diagnosis of sleep apnea. she does not have a history of rheumatic fever. she does not have a history of alcohol use. The patient does not have a history of early familial atrial fibrillation or other arrhythmias.  she has a BMI of Body mass index is 30.54 kg/m.Marland Kitchen Filed Weights   07/19/19 0931  Weight: 72.1 kg    Family History  Problem Relation Age of Onset  . Hypertension Mother   . Heart disease Mother   . Heart disease Father   . Colon cancer Neg Hx   . Esophageal cancer Neg Hx   . Rectal  cancer Neg Hx   . Stomach cancer Neg Hx      Atrial Fibrillation Management history:  Previous antiarrhythmic drugs: none Previous cardioversions: none  Previous ablations: none CHADS2VASC score: 3 Anticoagulation history: Eliquis   Past Medical History:  Diagnosis Date  . Cystocele   . Elevated cholesterol   . Fibroid   . Hypertension   . Kidney stone    passed stone  . Osteopenia   . Perimenopausal vasomotor symptoms   . Rectocele   . SVD (spontaneous vaginal delivery)    x 2   Past Surgical History:  Procedure Laterality Date  . [place] SURGERY  2005  . AUGMENTATION MAMMAPLASTY     SALINE in 20's  . BREAST SURGERY     augmentation in 20's  . COLONOSCOPY    . RECTOCELE REPAIR N/A 10/31/2013   Procedure: POSTERIOR REPAIR (RECTOCELE);  Surgeon: [name], MD;  Location: [facility];  Service: Gynecology;  Laterality: N/A;  . right arm surgery     tendon repair  . TONSILLECTOMY    . TOTAL HIP ARTHROPLASTY  2001   RIGHT  . TUBAL LIGATION    . VAGINAL HYSTERECTOMY  1992  . WISDOM TOOTH EXTRACTION      Current Outpatient Medications  Medication Sig Dispense Refill  . amoxicillin (AMOXIL) 500 MG tablet TK 4 TS PO 1 HOUR PTA    . b complex vitamins tablet Take 1 tablet by mouth daily.    . Calcium Carbonate-Vitamin D (CALCIUM + D PO) Take 1 tablet by mouth  daily.     . cholecalciferol (VITAMIN D) 1000 UNITS tablet Take 1,000 Units by mouth daily.     . Coenzyme Q10 (COQ10 PO) Take 1 capsule by mouth daily.     . diazepam (VALIUM) 10 MG tablet TAKE 1 TABLET BY MOUTH EVERY 6 HOURS AS NEEDED FOR ANXIETY 30 tablet 2  . ELIQUIS 5 MG TABS tablet TAKE 1 TABLET BY MOUTH  TWICE DAILY 180 tablet 1  . hydrochlorothiazide (MICROZIDE) 12.5 MG capsule Take 1 capsule (12.5 mg total) by mouth every morning. 30 capsule 0  . metoprolol succinate (TOPROL-XL) 25 MG 24 hr tablet TAKE 1 TABLET BY MOUTH DAILY 90 tablet 2  . sertraline (ZOLOFT) 100 MG tablet Take 100 mg by mouth daily.    .  simvastatin (ZOCOR) 40 MG tablet Take 20 mg by mouth daily. Takes 1/2 of 40mg  tablet     No current facility-administered medications for this encounter.    Allergies  Allergen Reactions  . Codeine Nausea And Vomiting    Social History   Socioeconomic History  . Marital status: Married    Spouse name: Not on file  . Number of children: Not on file  . Years of education: Not on file  . Highest education level: Not on file  Occupational History  . Not on file  Tobacco Use  . Smoking status: Current Some Day Smoker    Packs/day: 0.10    Types: Cigarettes  . Smokeless tobacco: Never Used  . Tobacco comment: pack a week  Substance and Sexual Activity  . Alcohol use: Yes    Alcohol/week: 2.0 standard drinks    Types: 2 Glasses of wine per week    Comment: red wine x 4-5 nights per week  . Drug use: No  . Sexual activity: Not Currently    Birth control/protection: Surgical    Comment: 1st intercourse- 65, partners-20,  married- 2 yrs   Other Topics Concern  . Not on file  Social History Narrative  . Not on file   Social Determinants of Health   Financial Resource Strain:   . Difficulty of Paying Living Expenses:   Food Insecurity:   . Worried About Charity fundraiser in the Last Year:   . Arboriculturist in the Last Year:   Transportation Needs:   . Film/video editor (Medical):   Marland Kitchen Lack of Transportation (Non-Medical):   Physical Activity:   . Days of Exercise per Week:   . Minutes of Exercise per Session:   Stress:   . Feeling of Stress :   Social Connections:   . Frequency of Communication with Friends and Family:   . Frequency of Social Gatherings with Friends and Family:   . Attends Religious Services:   . Active Member of Clubs or Organizations:   . Attends Archivist Meetings:   Marland Kitchen Marital Status:   Intimate Partner Violence:   . Fear of Current or Ex-Partner:   . Emotionally Abused:   Marland Kitchen Physically Abused:   . Sexually Abused:       ROS- All systems are reviewed and negative except as per the HPI above.  Physical Exam: Vitals:   07/19/19 0931  BP: 124/80  Pulse: 78  Weight: 72.1 kg  Height: 5' 0.5" (1.537 m)    GEN- The patient is well appearing obese female, alert and oriented x 3 today.   HEENT-head normocephalic, atraumatic, sclera clear, conjunctiva pink, hearing intact, trachea midline. Lungs- Clear to ausculation  bilaterally, normal work of breathing Heart- Regular rate and rhythm, no murmurs, rubs or gallops  GI- soft, NT, ND, + BS Extremities- no clubbing, cyanosis, or edema MS- no significant deformity or atrophy Skin- no rash or lesion Psych- euthymic mood, full affect Neuro- strength and sensation are intact   Wt Readings from Last 3 Encounters:  07/19/19 72.1 kg  03/15/19 73.9 kg  09/28/18 68 kg    EKG today demonstrates SR HR 78, LAFB, PR 188, QRS 78, QTc 462   Zio patch 08/18/18 Predominant rhythm is sinus rhythm Atrial Fibrillation is observed (3% burden) which likely explains palpitations with heart rates ranging from 110-216 bpm (avg of 161 bpm) The longest afib episode lated 2 hours 55 mins with an avg rate of 161 bpm.   Echo 08/29/18 1. The left ventricle has normal systolic function, with an ejection fraction of 55-60%. The cavity size was normal. Left ventricular diastolic Doppler parameters are consistent with impaired relaxation. No evidence of left ventricular regional wall  motion abnormalities.  2. The right ventricle has normal systolic function. The cavity was normal. There is no increase in right ventricular wall thickness.  3. No evidence of mitral valve stenosis. Trivial mitral regurgitation.  4. The aortic valve is tricuspid. Aortic valve regurgitation is trivial by color flow Doppler. No stenosis of the aortic valve.  5. The aortic root is normal in size and structure.  6. Normal IVC size. No complete TR doppler jet so unable to estimate PA systolic  pressure.   Epic records are reviewed at length today  Assessment and Plan: 1. Paroxysmal atrial fibrillation Patient appears to be maintaining SR. Continue Toprol 25 mg daily.  Continue Eliquis 5 mg BID. Lifestyle changes as below and including smoking cessation.   This patients CHA2DS2-VASc Score and unadjusted Ischemic Stroke Rate (% per year) is equal to 3.2 % stroke rate/year from a score of 3  Above score calculated as 1 point each if present [CHF, HTN, DM, Vascular=MI/PAD/Aortic Plaque, Age if 65-74, or Female] Above score calculated as 2 points each if present [Age > 75, or Stroke/TIA/TE]  2. HTN Stable, no changes today.  3. Obesity Body mass index is 30.54 kg/m. Lifestyle modification was discussed and encouraged including regular physical activity and weight reduction.   Follow up in the AF clinic in 6 months.     [facility] [address] [place], [place] [address] [phone] 07/19/2019 9:51 AM

## 2019-08-21 ENCOUNTER — Other Ambulatory Visit: Payer: Self-pay

## 2019-08-21 ENCOUNTER — Ambulatory Visit (HOSPITAL_BASED_OUTPATIENT_CLINIC_OR_DEPARTMENT_OTHER)
Admission: RE | Admit: 2019-08-21 | Discharge: 2019-08-21 | Disposition: A | Discharge status: Home / Self Care | Payer: Medicare Other | Source: Ambulatory Visit | Attending: Family Medicine | Admitting: Family Medicine

## 2019-08-21 DIAGNOSIS — Z1231 Encounter for screening mammogram for malignant neoplasm of breast: Secondary | ICD-10-CM | POA: Diagnosis not present

## 2019-09-20 ENCOUNTER — Encounter: Payer: Self-pay | Admitting: Obstetrics & Gynecology

## 2019-09-20 ENCOUNTER — Ambulatory Visit (INDEPENDENT_AMBULATORY_CARE_PROVIDER_SITE_OTHER): Payer: Medicare Other | Admitting: Obstetrics & Gynecology

## 2019-09-20 ENCOUNTER — Other Ambulatory Visit: Payer: Self-pay

## 2019-09-20 VITALS — BP 130/70 | Ht 60.25 in | Wt 157.0 lb

## 2019-09-20 DIAGNOSIS — Z780 Asymptomatic menopausal state: Secondary | ICD-10-CM

## 2019-09-20 DIAGNOSIS — Z6830 Body mass index (BMI) 30.0-30.9, adult: Secondary | ICD-10-CM

## 2019-09-20 DIAGNOSIS — Z90710 Acquired absence of both cervix and uterus: Secondary | ICD-10-CM | POA: Diagnosis not present

## 2019-09-20 DIAGNOSIS — M8588 Other specified disorders of bone density and structure, other site: Secondary | ICD-10-CM | POA: Diagnosis not present

## 2019-09-20 DIAGNOSIS — E6609 Other obesity due to excess calories: Secondary | ICD-10-CM

## 2019-09-20 DIAGNOSIS — Z01419 Encounter for gynecological examination (general) (routine) without abnormal findings: Secondary | ICD-10-CM | POA: Diagnosis not present

## 2019-09-20 NOTE — Progress Notes (Signed)
Kathy [name] Jul 29, 1950 [id number]   History:    69 y.o. G3P2A1 Married  RP:  Established patient presenting for annual gyn exam   HPI: S/P Vaginal hysterectomy.  Post-menopause, well on no HRT.  No pelvic pain.  Rarely sexually active.  Urine/BMs normal.  Breasts normal with bilateral implants.  Investigated and treated for AFib. BMI 30.41.  Walking her dog regularly. [place] 2014.  Health labs with Fam MD. On Zoloft for anxiety.  Past medical history,surgical history, family history and social history were all reviewed and documented in the EPIC chart.  Gynecologic History No LMP recorded. Patient has had a hysterectomy.  Obstetric History OB History  Gravida Para Term Preterm AB Living  3 2     1 2   SAB TAB Ectopic Multiple Live Births               # Outcome Date GA Lbr Len/2nd Weight Sex Delivery Anes PTL Lv  3 AB           2 Para           1 Para              ROS: A ROS was performed and pertinent positives and negatives are included in the history.  GENERAL: No fevers or chills. HEENT: No change in vision, no earache, sore throat or sinus congestion. NECK: No pain or stiffness. CARDIOVASCULAR: No chest pain or pressure. No palpitations. PULMONARY: No shortness of breath, cough or wheeze. GASTROINTESTINAL: No abdominal pain, nausea, vomiting or diarrhea, melena or bright red blood per rectum. GENITOURINARY: No urinary frequency, urgency, hesitancy or dysuria. MUSCULOSKELETAL: No joint or muscle pain, no back pain, no recent trauma. DERMATOLOGIC: No rash, no itching, no lesions. ENDOCRINE: No polyuria, polydipsia, no heat or cold intolerance. No recent change in weight. HEMATOLOGICAL: No anemia or easy bruising or bleeding. NEUROLOGIC: No headache, seizures, numbness, tingling or weakness. PSYCHIATRIC: No depression, no loss of interest in normal activity or change in sleep pattern.     Exam:   BP (!) 130/70   Ht 5' 0.25" (1.53 m)   Wt 157 lb (71.2 kg)   BMI 30.41  kg/m   Body mass index is 30.41 kg/m.  General appearance : Well developed well nourished female. No acute distress HEENT: Eyes: no retinal hemorrhage or exudates,  Neck supple, trachea midline, no carotid bruits, no thyroidmegaly Lungs: Clear to auscultation, no rhonchi or wheezes, or rib retractions  Heart: Regular rate and rhythm, no murmurs or gallops Breast:Examined in sitting and supine position were symmetrical in appearance, no palpable masses or tenderness,  no skin retraction, no nipple inversion, no nipple discharge, no skin discoloration, no axillary or supraclavicular lymphadenopathy Abdomen: no palpable masses or tenderness, no rebound or guarding Extremities: no edema or skin discoloration or tenderness  Pelvic: Vulva: Normal             Vagina: No gross lesions or discharge  Cervix/Uterus absent  Adnexa  Without masses or tenderness  Anus: Normal   Assessment/Plan:  69 y.o. female for annual exam   1. Well female exam with routine gynecological exam Gynecologic exam status post total hysterectomy.  No indication to repeat a Pap test this year.  Breast exam status post bilateral implants.  Screening mammogram June 2021 was negative.  Colonoscopy 2014.  Health labs with family physician.  2. S/P total hysterectomy  3. Postmenopause Well on no hormone replacement therapy.  4. Osteopenia of lumbar spine Osteopenia  on bone density in 2018.  Repeat bone density now.  Vitamin D supplements, calcium intake of 1200 mg daily and regular weightbearing physical activity is to continue. - DG Bone Density; Future  5. Class 1 obesity due to excess calories with serious comorbidity and body mass index (BMI) of 30.0 to 30.9 in adult Patient has a good nutrition, recommend reducing the portions for weight loss.  Aerobic activities 5 times a week and light weightlifting every 2 days  Princess Bruins MD, 9:35 AM 09/20/2019

## 2019-10-20 ENCOUNTER — Other Ambulatory Visit (HOSPITAL_COMMUNITY): Payer: Self-pay | Admitting: Physician Assistant

## 2020-01-27 ENCOUNTER — Other Ambulatory Visit (HOSPITAL_COMMUNITY): Payer: Self-pay | Admitting: Physician Assistant

## 2020-01-28 ENCOUNTER — Other Ambulatory Visit (HOSPITAL_COMMUNITY): Payer: Self-pay | Admitting: Physician Assistant

## 2020-02-25 ENCOUNTER — Other Ambulatory Visit (HOSPITAL_COMMUNITY): Payer: Self-pay | Admitting: Physician Assistant

## 2020-03-15 ENCOUNTER — Other Ambulatory Visit (HOSPITAL_COMMUNITY): Payer: Self-pay | Admitting: Physician Assistant

## 2020-03-25 DIAGNOSIS — D6869 Other thrombophilia: Secondary | ICD-10-CM | POA: Diagnosis not present

## 2020-03-25 DIAGNOSIS — E782 Mixed hyperlipidemia: Secondary | ICD-10-CM | POA: Diagnosis not present

## 2020-03-25 DIAGNOSIS — Z7901 Long term (current) use of anticoagulants: Secondary | ICD-10-CM | POA: Diagnosis not present

## 2020-03-25 DIAGNOSIS — I480 Paroxysmal atrial fibrillation: Secondary | ICD-10-CM | POA: Diagnosis not present

## 2020-03-25 DIAGNOSIS — M79674 Pain in right toe(s): Secondary | ICD-10-CM | POA: Diagnosis not present

## 2020-03-25 DIAGNOSIS — I10 Essential (primary) hypertension: Secondary | ICD-10-CM | POA: Diagnosis not present

## 2020-04-10 DIAGNOSIS — H43393 Other vitreous opacities, bilateral: Secondary | ICD-10-CM | POA: Diagnosis not present

## 2020-04-10 DIAGNOSIS — D3131 Benign neoplasm of right choroid: Secondary | ICD-10-CM | POA: Diagnosis not present

## 2020-04-10 DIAGNOSIS — H43813 Vitreous degeneration, bilateral: Secondary | ICD-10-CM | POA: Diagnosis not present

## 2020-04-10 DIAGNOSIS — H35361 Drusen (degenerative) of macula, right eye: Secondary | ICD-10-CM | POA: Diagnosis not present

## 2020-04-18 ENCOUNTER — Encounter: Payer: Self-pay | Admitting: Podiatry

## 2020-04-18 ENCOUNTER — Ambulatory Visit (INDEPENDENT_AMBULATORY_CARE_PROVIDER_SITE_OTHER): Payer: Medicare Other

## 2020-04-18 ENCOUNTER — Other Ambulatory Visit: Payer: Self-pay

## 2020-04-18 ENCOUNTER — Ambulatory Visit: Payer: Medicare Other | Admitting: Podiatry

## 2020-04-18 DIAGNOSIS — M7731 Calcaneal spur, right foot: Secondary | ICD-10-CM

## 2020-04-18 DIAGNOSIS — F419 Anxiety disorder, unspecified: Secondary | ICD-10-CM | POA: Insufficient documentation

## 2020-04-18 DIAGNOSIS — M19071 Primary osteoarthritis, right ankle and foot: Secondary | ICD-10-CM | POA: Diagnosis not present

## 2020-04-18 DIAGNOSIS — Z8639 Personal history of other endocrine, nutritional and metabolic disease: Secondary | ICD-10-CM | POA: Insufficient documentation

## 2020-04-18 DIAGNOSIS — Z6830 Body mass index (BMI) 30.0-30.9, adult: Secondary | ICD-10-CM | POA: Insufficient documentation

## 2020-04-18 DIAGNOSIS — Z86010 Personal history of colonic polyps: Secondary | ICD-10-CM | POA: Insufficient documentation

## 2020-04-18 DIAGNOSIS — M779 Enthesopathy, unspecified: Secondary | ICD-10-CM

## 2020-04-18 DIAGNOSIS — D2371 Other benign neoplasm of skin of right lower limb, including hip: Secondary | ICD-10-CM

## 2020-04-18 DIAGNOSIS — E559 Vitamin D deficiency, unspecified: Secondary | ICD-10-CM | POA: Insufficient documentation

## 2020-04-18 DIAGNOSIS — J309 Allergic rhinitis, unspecified: Secondary | ICD-10-CM | POA: Insufficient documentation

## 2020-04-18 DIAGNOSIS — F411 Generalized anxiety disorder: Secondary | ICD-10-CM | POA: Insufficient documentation

## 2020-04-18 DIAGNOSIS — Z7901 Long term (current) use of anticoagulants: Secondary | ICD-10-CM | POA: Insufficient documentation

## 2020-04-21 DIAGNOSIS — Z0000 Encounter for general adult medical examination without abnormal findings: Secondary | ICD-10-CM | POA: Diagnosis not present

## 2020-04-21 DIAGNOSIS — R059 Cough, unspecified: Secondary | ICD-10-CM | POA: Diagnosis not present

## 2020-04-21 DIAGNOSIS — I10 Essential (primary) hypertension: Secondary | ICD-10-CM | POA: Diagnosis not present

## 2020-04-21 DIAGNOSIS — E782 Mixed hyperlipidemia: Secondary | ICD-10-CM | POA: Diagnosis not present

## 2020-04-21 NOTE — Progress Notes (Signed)
Subjective:   Patient ID: Kathy [name], female   DOB: 70 y.o.   MRN: [id number]   HPI 70 year old female presents the office with concerns of a painful skin lesion on the bottom of her right big toe which is been ongoing for the last 3 months.  She denies any recent injury or trauma or stepping on any foreign objects.  No swelling or redness.  She said no recent treatment.  Area is tender with pressure.  She has no other concerns today.   Review of Systems  All other systems reviewed and are negative.  Past Medical History:  Diagnosis Date  . Cystocele   . Elevated cholesterol   . Fibroid   . Hypertension   . Kidney stone    passed stone  . Osteopenia   . Perimenopausal vasomotor symptoms   . Rectocele   . SVD (spontaneous vaginal delivery)    x 2    Past Surgical History:  Procedure Laterality Date  . [place] SURGERY  2005  . AUGMENTATION MAMMAPLASTY     SALINE in 20's  . BREAST SURGERY     augmentation in 20's  . COLONOSCOPY    . RECTOCELE REPAIR N/A 10/31/2013   Procedure: POSTERIOR REPAIR (RECTOCELE);  Surgeon: [name], MD;  Location: [facility];  Service: Gynecology;  Laterality: N/A;  . right arm surgery     tendon repair  . TONSILLECTOMY    . TOTAL HIP ARTHROPLASTY  2001   RIGHT  . TUBAL LIGATION    . VAGINAL HYSTERECTOMY  1992  . WISDOM TOOTH EXTRACTION       Current Outpatient Medications:  .  b complex vitamins tablet, Take 1 tablet by mouth daily., Disp: , Rfl:  .  benzonatate (TESSALON) 100 MG capsule, Take 100 mg by mouth 3 (three) times daily as needed., Disp: , Rfl:  .  Calcium Carbonate-Vitamin D (CALCIUM + D PO), Take 1 tablet by mouth daily. , Disp: , Rfl:  .  chlorthalidone (HYGROTON) 25 MG tablet, TAKE 1/2 TABLET BY MOUTH EVERY DAY IN THE MORNING, Disp: , Rfl:  .  cholecalciferol (VITAMIN D) 1000 UNITS tablet, Take 1,000 Units by mouth daily. , Disp: , Rfl:  .  Coenzyme Q10 (COQ10 PO), Take 1 capsule by mouth daily. , Disp: , Rfl:  .   diazepam (VALIUM) 10 MG tablet, TAKE 1 TABLET BY MOUTH EVERY 6 HOURS AS NEEDED FOR ANXIETY, Disp: 30 tablet, Rfl: 2 .  ELIQUIS 5 MG TABS tablet, TAKE 1 TABLET BY MOUTH  TWICE DAILY, Disp: 180 tablet, Rfl: 3 .  hydrochlorothiazide (MICROZIDE) 12.5 MG capsule, Take 1 capsule (12.5 mg total) by mouth every morning., Disp: 30 capsule, Rfl: 0 .  meloxicam (MOBIC) 15 MG tablet, 1 tablet, Disp: , Rfl:  .  metoprolol succinate (TOPROL-XL) 25 MG 24 hr tablet, Take 1 tablet (25 mg total) by mouth daily. Appointment Required For Further Refills [phone], Disp: 15 tablet, Rfl: 0 .  sertraline (ZOLOFT) 100 MG tablet, Take 100 mg by mouth daily., Disp: , Rfl:  .  simvastatin (ZOCOR) 40 MG tablet, Take 20 mg by mouth daily. Takes 1/2 of 40mg  tablet, Disp: , Rfl:  .  simvastatin (ZOCOR) 80 MG tablet, Take 80 mg by mouth daily., Disp: , Rfl:   Allergies  Allergen Reactions  . Codeine Nausea And Vomiting  . Prednisone Other (See Comments)          Objective:  Physical Exam  General: AAO x3, NAD  Dermatological: Thick hyperkeratotic tissue present on the plantar aspect of the right hallux IPJ plantarly.  There is no underlying ulceration drainage or any signs of infection.  There is no evidence of foreign body.  No edema, erythema.  Vascular: Dorsalis Pedis artery and Posterior Tibial artery pedal pulses are 2/4 bilateral with immedate capillary fill time. There is no pain with calf compression, swelling, warmth, erythema.   Neruologic: Grossly intact via light touch bilateral.   Musculoskeletal: Bony prominence on plantar aspect of the toe.  No other areas of discomfort identified today.  Muscular strength 5/5 in all groups tested bilateral.  Gait: Unassisted, Nonantalgic.       Assessment:   70 year old female with hyperkeratotic lesion right plantar hallux     Plan:  -Treatment options discussed including all alternatives, risks, and complications -Etiology of symptoms were  discussed -Debrided the hyperkeratotic lesion with any complications or bleeding.  Dispensed offloading pads.  Discussed moisturizer.  X-ray ordered.  Discussed shoe modifications.  Trula Slade DPM

## 2020-07-15 ENCOUNTER — Other Ambulatory Visit (HOSPITAL_BASED_OUTPATIENT_CLINIC_OR_DEPARTMENT_OTHER): Payer: Self-pay | Admitting: Family Medicine

## 2020-07-15 DIAGNOSIS — Z1231 Encounter for screening mammogram for malignant neoplasm of breast: Secondary | ICD-10-CM

## 2020-08-26 ENCOUNTER — Ambulatory Visit (HOSPITAL_BASED_OUTPATIENT_CLINIC_OR_DEPARTMENT_OTHER)
Admission: RE | Admit: 2020-08-26 | Discharge: 2020-08-26 | Disposition: A | Discharge status: Home / Self Care | Payer: Medicare Other | Source: Ambulatory Visit | Attending: Family Medicine | Admitting: Family Medicine

## 2020-08-26 ENCOUNTER — Other Ambulatory Visit: Payer: Self-pay

## 2020-08-26 ENCOUNTER — Encounter (HOSPITAL_BASED_OUTPATIENT_CLINIC_OR_DEPARTMENT_OTHER): Payer: Self-pay

## 2020-08-26 DIAGNOSIS — Z1231 Encounter for screening mammogram for malignant neoplasm of breast: Secondary | ICD-10-CM | POA: Insufficient documentation

## 2020-10-29 DIAGNOSIS — U071 COVID-19: Secondary | ICD-10-CM | POA: Diagnosis not present

## 2020-11-09 ENCOUNTER — Other Ambulatory Visit (HOSPITAL_COMMUNITY): Payer: Self-pay | Admitting: Physician Assistant

## 2020-12-04 DIAGNOSIS — L03116 Cellulitis of left lower limb: Secondary | ICD-10-CM | POA: Diagnosis not present

## 2020-12-22 DIAGNOSIS — E782 Mixed hyperlipidemia: Secondary | ICD-10-CM | POA: Diagnosis not present

## 2020-12-22 DIAGNOSIS — I10 Essential (primary) hypertension: Secondary | ICD-10-CM | POA: Diagnosis not present

## 2020-12-22 DIAGNOSIS — M8580 Other specified disorders of bone density and structure, unspecified site: Secondary | ICD-10-CM | POA: Diagnosis not present

## 2020-12-22 DIAGNOSIS — I480 Paroxysmal atrial fibrillation: Secondary | ICD-10-CM | POA: Diagnosis not present

## 2020-12-29 ENCOUNTER — Other Ambulatory Visit (HOSPITAL_COMMUNITY): Payer: Self-pay

## 2020-12-29 ENCOUNTER — Other Ambulatory Visit: Payer: Self-pay

## 2020-12-29 ENCOUNTER — Encounter (HOSPITAL_COMMUNITY): Payer: Self-pay | Admitting: Physician Assistant

## 2020-12-29 ENCOUNTER — Ambulatory Visit (HOSPITAL_COMMUNITY)
Admission: RE | Admit: 2020-12-29 | Discharge: 2020-12-29 | Disposition: A | Discharge status: Home / Self Care | Payer: Medicare Other | Source: Ambulatory Visit | Attending: Physician Assistant | Admitting: Physician Assistant

## 2020-12-29 VITALS — BP 126/86 | HR 67 | Ht 60.25 in | Wt 154.2 lb

## 2020-12-29 DIAGNOSIS — I480 Paroxysmal atrial fibrillation: Secondary | ICD-10-CM | POA: Diagnosis not present

## 2020-12-29 DIAGNOSIS — F17210 Nicotine dependence, cigarettes, uncomplicated: Secondary | ICD-10-CM | POA: Insufficient documentation

## 2020-12-29 DIAGNOSIS — D6869 Other thrombophilia: Secondary | ICD-10-CM

## 2020-12-29 DIAGNOSIS — Z7901 Long term (current) use of anticoagulants: Secondary | ICD-10-CM | POA: Diagnosis not present

## 2020-12-29 DIAGNOSIS — Z79899 Other long term (current) drug therapy: Secondary | ICD-10-CM | POA: Diagnosis not present

## 2020-12-29 DIAGNOSIS — I10 Essential (primary) hypertension: Secondary | ICD-10-CM | POA: Insufficient documentation

## 2020-12-29 LAB — CBC
HCT: 45.2 % (ref 36.0–46.0)
Hemoglobin: 14.6 g/dL (ref 12.0–15.0)
MCH: 29.5 pg (ref 26.0–34.0)
MCHC: 32.3 g/dL (ref 30.0–36.0)
MCV: 91.3 fL (ref 80.0–100.0)
Platelets: 212 10*3/uL (ref 150–400)
RBC: 4.95 MIL/uL (ref 3.87–5.11)
RDW: 14.3 % (ref 11.5–15.5)
WBC: 4.3 10*3/uL (ref 4.0–10.5)
nRBC: 0 % (ref 0.0–0.2)

## 2020-12-29 LAB — BASIC METABOLIC PANEL
Anion gap: 11 (ref 5–15)
BUN: 11 mg/dL (ref 8–23)
CO2: 23 mmol/L (ref 22–32)
Calcium: 10.1 mg/dL (ref 8.9–10.3)
Chloride: 101 mmol/L (ref 98–111)
Creatinine, Ser: 0.54 mg/dL (ref 0.44–1.00)
GFR, Estimated: >60 mL/min (ref >60)
Glucose, Bld: 112 mg/dL — ABNORMAL HIGH (ref 70–99)
Potassium: 3.7 mmol/L (ref 3.5–5.1)
Sodium: 135 mmol/L (ref 135–145)

## 2020-12-29 MED ORDER — METOPROLOL SUCCINATE ER 25 MG PO TB24: 25.0000 mg | ORAL_TABLET | Freq: Every day | ORAL | 3 refills | Status: DC

## 2020-12-29 MED ORDER — APIXABAN 5 MG PO TABS: 5.0000 mg | ORAL_TABLET | Freq: Two times a day (BID) | ORAL | 3 refills | Status: DC

## 2020-12-29 MED ORDER — APIXABAN 5 MG PO TABS: 5.0000 mg | ORAL_TABLET | Freq: Two times a day (BID) | ORAL | 0 refills | Status: DC

## 2020-12-29 NOTE — Progress Notes (Signed)
Primary Care Physician: Lyman Bishop, DO Primary Cardiologist: none Primary Electrophysiologist: none Referring Physician: Dr Clemmie Krill is a 70 y.o. female with a history of HTN, tobacco abuse, and new onset paroxysmal atrial fibrillation who presents for follow up in the [facility].  The patient was initially diagnosed with atrial fibrillation on 08/18/18 after presenting with symptoms of palpitations and wearing a 3 day heart monitor which showed 3% AF burden. Patient reports that these palpitations had been going on for about one month prior to her diagnosis, about 2 episodes per week. She denies any specific triggers. She denies snoring or significant alcohol use. She is on Eliquis for a CHADS2VASC score of 3.  On follow up today, patient reports that she has done well since her last visit. She has rare, brief palpitations lasting only a couple minutes. She denies any bleeding issues on anticoagulation.   Today, she denies symptoms of chest pain, shortness of breath, orthopnea, PND, lower extremity edema, dizziness, presyncope, syncope, snoring, daytime somnolence, bleeding, or neurologic sequela. The patient is tolerating medications without difficulties and is otherwise without complaint today.    Atrial Fibrillation Risk Factors:  she does not have symptoms or diagnosis of sleep apnea. she does not have a history of rheumatic fever. she does not have a history of alcohol use. The patient does not have a history of early familial atrial fibrillation or other arrhythmias.  she has a BMI of Body mass index is 29.87 kg/m.Marland Kitchen Filed Weights   12/29/20 0935  Weight: 69.9 kg     Family History  Problem Relation Age of Onset   Hypertension Mother    Heart disease Mother    Heart disease Father    Colon cancer Neg Hx    Esophageal cancer Neg Hx    Rectal cancer Neg Hx    Stomach cancer Neg Hx      Atrial Fibrillation Management  history:  Previous antiarrhythmic drugs: none Previous cardioversions: none  Previous ablations: none CHADS2VASC score: 3 Anticoagulation history: Eliquis   Past Medical History:  Diagnosis Date   Cystocele    Elevated cholesterol    Fibroid    Hypertension    Kidney stone    passed stone   Osteopenia    Perimenopausal vasomotor symptoms    Rectocele    SVD (spontaneous vaginal delivery)    x 2   Past Surgical History:  Procedure Laterality Date   ARM SURGERY  2005   AUGMENTATION MAMMAPLASTY     SALINE in 20's   BREAST SURGERY     augmentation in 20's   COLONOSCOPY     RECTOCELE REPAIR N/A 10/31/2013   Procedure: POSTERIOR REPAIR (RECTOCELE);  Surgeon: [name], MD;  Location: [facility];  Service: Gynecology;  Laterality: N/A;   right arm surgery     tendon repair   TONSILLECTOMY     TOTAL HIP ARTHROPLASTY  2001   RIGHT   TUBAL LIGATION     VAGINAL HYSTERECTOMY  1992   WISDOM TOOTH EXTRACTION      Current Outpatient Medications  Medication Sig Dispense Refill   b complex vitamins tablet Take 1 tablet by mouth daily.     Calcium Carbonate-Vitamin D (CALCIUM + D PO) Take 1 tablet by mouth daily.      chlorthalidone (HYGROTON) 25 MG tablet TAKE 1/2 TABLET BY MOUTH EVERY DAY IN THE MORNING     cholecalciferol (VITAMIN D) 1000 UNITS  tablet Take 1,000 Units by mouth daily.      Coenzyme Q10 (COQ10 PO) Take 1 capsule by mouth daily.      diazepam (VALIUM) 10 MG tablet TAKE 1 TABLET BY MOUTH EVERY 6 HOURS AS NEEDED FOR ANXIETY 30 tablet 2   hydrochlorothiazide (MICROZIDE) 12.5 MG capsule Take 1 capsule (12.5 mg total) by mouth every morning. 30 capsule 0   meloxicam (MOBIC) 15 MG tablet 1 tablet     sertraline (ZOLOFT) 100 MG tablet Take 100 mg by mouth daily.     simvastatin (ZOCOR) 80 MG tablet Take 80 mg by mouth daily.     apixaban (ELIQUIS) 5 MG TABS tablet Take 1 tablet (5 mg total) by mouth 2 (two) times daily. 180 tablet 3   metoprolol succinate (TOPROL-XL)  25 MG 24 hr tablet Take 1 tablet (25 mg total) by mouth daily. 90 tablet 3   No current facility-administered medications for this encounter.    Allergies  Allergen Reactions   Codeine Nausea And Vomiting   Prednisone Other (See Comments)    Social History   Socioeconomic History   Marital status: Married    Spouse name: Not on file   Number of children: Not on file   Years of education: Not on file   Highest education level: Not on file  Occupational History   Not on file  Tobacco Use   Smoking status: Some Days    Packs/day: 0.10    Types: Cigarettes   Smokeless tobacco: Never   Tobacco comments:    3 cigarettes daily 12/29/2020  Vaping Use   Vaping Use: Never used  Substance and Sexual Activity   Alcohol use: Yes    Alcohol/week: 3.0 standard drinks    Types: 3 Glasses of wine per week    Comment: red wine x 3 nights per week   Drug use: No   Sexual activity: Not Currently    Birth control/protection: Surgical    Comment: 1st intercourse- 71, partners-20,  married- 28 yrs   Other Topics Concern   Not on file  Social History Narrative   Not on file   Social Determinants of Health   Financial Resource Strain: Not on file  Food Insecurity: Not on file  Transportation Needs: Not on file  Physical Activity: Not on file  Stress: Not on file  Social Connections: Not on file  Intimate Partner Violence: Not on file     ROS- All systems are reviewed and negative except as per the HPI above.  Physical Exam: Vitals:   12/29/20 0935  BP: 126/86  Pulse: 67  Weight: 69.9 kg  Height: 5' 0.25" (1.53 m)   GEN- The patient is a well appearing female, alert and oriented x 3 today.   HEENT-head normocephalic, atraumatic, sclera clear, conjunctiva pink, hearing intact, trachea midline. Lungs- Clear to ausculation bilaterally, normal work of breathing Heart- Regular rate and rhythm, no murmurs, rubs or gallops  GI- soft, NT, ND, + BS Extremities- no clubbing,  cyanosis, or edema MS- no significant deformity or atrophy Skin- no rash or lesion Psych- euthymic mood, full affect Neuro- strength and sensation are intact   Wt Readings from Last 3 Encounters:  12/29/20 69.9 kg  09/20/19 71.2 kg  07/19/19 72.1 kg    EKG today demonstrates  SR Vent. rate 67 BPM PR interval 196 ms QRS duration 76 ms QT/QTcB 422/445 ms   Zio patch 08/18/18 Predominant rhythm is sinus rhythm Atrial Fibrillation is observed (3% burden)  which likely explains palpitations with heart rates ranging from 110-216 bpm (avg of 161 bpm) The longest afib episode lated 2 hours 55 mins with an avg rate of 161 bpm.   Echo 08/29/18 1. The left ventricle has normal systolic function, with an ejection fraction of 55-60%. The cavity size was normal. Left ventricular diastolic Doppler parameters are consistent with impaired relaxation. No evidence of left ventricular regional wall  motion abnormalities.  2. The right ventricle has normal systolic function. The cavity was normal. There is no increase in right ventricular wall thickness.  3. No evidence of mitral valve stenosis. Trivial mitral regurgitation.  4. The aortic valve is tricuspid. Aortic valve regurgitation is trivial by color flow Doppler. No stenosis of the aortic valve.  5. The aortic root is normal in size and structure.  6. Normal IVC size. No complete TR doppler jet so unable to estimate PA systolic pressure.   Epic records are reviewed at length today  Assessment and Plan: 1. Paroxysmal atrial fibrillation Patient appears to be maintaining SR. Continue Toprol 25 mg daily.  Continue Eliquis 5 mg BID. Check bmet/cbc  This patients CHA2DS2-VASc Score and unadjusted Ischemic Stroke Rate (% per year) is equal to 3.2 % stroke rate/year from a score of 3  Above score calculated as 1 point each if present [CHF, HTN, DM, Vascular=MI/PAD/Aortic Plaque, Age if 65-74, or Female] Above score calculated as 2 points each  if present [Age > 75, or Stroke/TIA/TE]  2. HTN Stable, no changes today.   Follow up in the AF clinic in one year.     [facility] [address] [place], [place] [address] [phone] 12/29/2020 9:52 AM

## 2021-01-05 DIAGNOSIS — I10 Essential (primary) hypertension: Secondary | ICD-10-CM | POA: Diagnosis not present

## 2021-01-05 DIAGNOSIS — I480 Paroxysmal atrial fibrillation: Secondary | ICD-10-CM | POA: Diagnosis not present

## 2021-01-05 DIAGNOSIS — E782 Mixed hyperlipidemia: Secondary | ICD-10-CM | POA: Diagnosis not present

## 2021-02-20 ENCOUNTER — Other Ambulatory Visit (HOSPITAL_COMMUNITY): Payer: Self-pay

## 2021-02-20 DIAGNOSIS — E782 Mixed hyperlipidemia: Secondary | ICD-10-CM | POA: Diagnosis not present

## 2021-02-20 DIAGNOSIS — I480 Paroxysmal atrial fibrillation: Secondary | ICD-10-CM | POA: Diagnosis not present

## 2021-02-20 DIAGNOSIS — M8580 Other specified disorders of bone density and structure, unspecified site: Secondary | ICD-10-CM | POA: Diagnosis not present

## 2021-02-20 DIAGNOSIS — I10 Essential (primary) hypertension: Secondary | ICD-10-CM | POA: Diagnosis not present

## 2021-02-20 MED ORDER — APIXABAN 5 MG PO TABS: 5.0000 mg | ORAL_TABLET | Freq: Two times a day (BID) | ORAL | 0 refills | Status: DC

## 2021-03-16 DIAGNOSIS — E559 Vitamin D deficiency, unspecified: Secondary | ICD-10-CM | POA: Diagnosis not present

## 2021-05-06 DIAGNOSIS — E782 Mixed hyperlipidemia: Secondary | ICD-10-CM | POA: Diagnosis not present

## 2021-05-06 DIAGNOSIS — I480 Paroxysmal atrial fibrillation: Secondary | ICD-10-CM | POA: Diagnosis not present

## 2021-05-06 DIAGNOSIS — I10 Essential (primary) hypertension: Secondary | ICD-10-CM | POA: Diagnosis not present

## 2021-06-02 DIAGNOSIS — Z0000 Encounter for general adult medical examination without abnormal findings: Secondary | ICD-10-CM | POA: Diagnosis not present

## 2021-06-02 DIAGNOSIS — E782 Mixed hyperlipidemia: Secondary | ICD-10-CM | POA: Diagnosis not present

## 2021-06-02 DIAGNOSIS — E210 Primary hyperparathyroidism: Secondary | ICD-10-CM | POA: Diagnosis not present

## 2021-06-02 DIAGNOSIS — M8580 Other specified disorders of bone density and structure, unspecified site: Secondary | ICD-10-CM | POA: Diagnosis not present

## 2021-06-02 DIAGNOSIS — I10 Essential (primary) hypertension: Secondary | ICD-10-CM | POA: Diagnosis not present

## 2021-06-02 DIAGNOSIS — Z23 Encounter for immunization: Secondary | ICD-10-CM | POA: Diagnosis not present

## 2021-06-02 DIAGNOSIS — I480 Paroxysmal atrial fibrillation: Secondary | ICD-10-CM | POA: Diagnosis not present

## 2021-06-02 DIAGNOSIS — D6869 Other thrombophilia: Secondary | ICD-10-CM | POA: Diagnosis not present

## 2021-06-03 ENCOUNTER — Other Ambulatory Visit (HOSPITAL_BASED_OUTPATIENT_CLINIC_OR_DEPARTMENT_OTHER): Payer: Self-pay | Admitting: Family Medicine

## 2021-06-03 DIAGNOSIS — M8580 Other specified disorders of bone density and structure, unspecified site: Secondary | ICD-10-CM

## 2021-06-09 ENCOUNTER — Other Ambulatory Visit (HOSPITAL_BASED_OUTPATIENT_CLINIC_OR_DEPARTMENT_OTHER): Payer: Self-pay | Admitting: Family Medicine

## 2021-06-09 ENCOUNTER — Ambulatory Visit (HOSPITAL_BASED_OUTPATIENT_CLINIC_OR_DEPARTMENT_OTHER)
Admission: RE | Admit: 2021-06-09 | Discharge: 2021-06-09 | Disposition: A | Discharge status: Home / Self Care | Payer: Medicare Other | Source: Ambulatory Visit | Attending: Family Medicine | Admitting: Family Medicine

## 2021-06-09 DIAGNOSIS — Z780 Asymptomatic menopausal state: Secondary | ICD-10-CM | POA: Insufficient documentation

## 2021-06-09 DIAGNOSIS — M85832 Other specified disorders of bone density and structure, left forearm: Secondary | ICD-10-CM | POA: Diagnosis not present

## 2021-06-09 DIAGNOSIS — M8580 Other specified disorders of bone density and structure, unspecified site: Secondary | ICD-10-CM | POA: Diagnosis not present

## 2021-06-09 DIAGNOSIS — Z1239 Encounter for other screening for malignant neoplasm of breast: Secondary | ICD-10-CM

## 2021-06-24 DIAGNOSIS — R197 Diarrhea, unspecified: Secondary | ICD-10-CM | POA: Diagnosis not present

## 2021-08-31 ENCOUNTER — Ambulatory Visit (HOSPITAL_BASED_OUTPATIENT_CLINIC_OR_DEPARTMENT_OTHER)
Admission: RE | Admit: 2021-08-31 | Discharge: 2021-08-31 | Disposition: A | Discharge status: Home / Self Care | Payer: Medicare Other | Source: Ambulatory Visit | Attending: Family Medicine | Admitting: Family Medicine

## 2021-08-31 ENCOUNTER — Encounter (HOSPITAL_BASED_OUTPATIENT_CLINIC_OR_DEPARTMENT_OTHER): Payer: Self-pay

## 2021-08-31 DIAGNOSIS — I2510 Atherosclerotic heart disease of native coronary artery without angina pectoris: Secondary | ICD-10-CM | POA: Diagnosis not present

## 2021-08-31 DIAGNOSIS — Z1239 Encounter for other screening for malignant neoplasm of breast: Secondary | ICD-10-CM

## 2021-08-31 DIAGNOSIS — Z1231 Encounter for screening mammogram for malignant neoplasm of breast: Secondary | ICD-10-CM | POA: Insufficient documentation

## 2021-09-21 ENCOUNTER — Other Ambulatory Visit (HOSPITAL_COMMUNITY): Payer: Self-pay | Admitting: Physician Assistant

## 2021-11-26 ENCOUNTER — Other Ambulatory Visit (HOSPITAL_COMMUNITY): Payer: Self-pay | Admitting: Physician Assistant

## 2021-12-07 ENCOUNTER — Inpatient Hospital Stay (HOSPITAL_COMMUNITY)
Admission: RE | Admit: 2021-12-07 | Discharge: 2021-12-07 | Disposition: A | Discharge status: Home / Self Care | Payer: Medicare Other | Source: Ambulatory Visit | Attending: Physician Assistant | Admitting: Physician Assistant

## 2021-12-07 ENCOUNTER — Ambulatory Visit (HOSPITAL_COMMUNITY)
Admission: RE | Admit: 2021-12-07 | Discharge: 2021-12-07 | Disposition: A | Discharge status: Home / Self Care | Payer: Medicare Other | Source: Ambulatory Visit | Attending: Physician Assistant | Admitting: Physician Assistant

## 2021-12-07 ENCOUNTER — Encounter (HOSPITAL_COMMUNITY): Payer: Self-pay | Admitting: Physician Assistant

## 2021-12-07 VITALS — BP 108/90 | HR 99 | Ht 60.25 in | Wt 157.8 lb

## 2021-12-07 DIAGNOSIS — R079 Chest pain, unspecified: Secondary | ICD-10-CM

## 2021-12-07 DIAGNOSIS — Z7901 Long term (current) use of anticoagulants: Secondary | ICD-10-CM | POA: Diagnosis not present

## 2021-12-07 DIAGNOSIS — I480 Paroxysmal atrial fibrillation: Secondary | ICD-10-CM

## 2021-12-07 DIAGNOSIS — I10 Essential (primary) hypertension: Secondary | ICD-10-CM | POA: Diagnosis not present

## 2021-12-07 DIAGNOSIS — Z87891 Personal history of nicotine dependence: Secondary | ICD-10-CM | POA: Diagnosis not present

## 2021-12-07 DIAGNOSIS — D6869 Other thrombophilia: Secondary | ICD-10-CM | POA: Diagnosis not present

## 2021-12-07 NOTE — Progress Notes (Signed)
Primary Care Physician: Lyman Bishop, DO Primary Cardiologist: none Primary Electrophysiologist: none Referring Physician: Dr Clemmie Krill is a 71 y.o. female with a history of HTN, tobacco abuse, and atrial fibrillation who presents for follow up in the [facility].  The patient was initially diagnosed with atrial fibrillation on 08/18/18 after presenting with symptoms of palpitations and wearing a 3 day heart monitor which showed 3% AF burden. Patient reports that these palpitations had been going on for about one month prior to her diagnosis, about 2 episodes per week. She denies any specific triggers. She denies snoring or significant alcohol use. She is on Eliquis for a CHADS2VASC score of 3.  On follow up today, patient presented for oral surgery today (have implant placed) and she was found to be in afib with mildly elevated heart rates. She was given labetalol at the dentist office and recommended to follow up here. She is unaware of her arrhythmia today. She has typically felt palpitations in the past. She has had 3 episodes of chest discomfort lasting 20 minutes to 1 hour which felt like "an elephant on her chest." The pain did not radiate and was not associated with any other symptoms. There were no specific triggers. Patient has been off of Eliquis x 2 days for her dental implant procedure.   Today, she denies symptoms of palpitations, shortness of breath, orthopnea, PND, lower extremity edema, dizziness, presyncope, syncope, snoring, daytime somnolence, bleeding, or neurologic sequela. The patient is tolerating medications without difficulties and is otherwise without complaint today.    Atrial Fibrillation Risk Factors:  she does not have symptoms or diagnosis of sleep apnea. she does not have a history of rheumatic fever. she does not have a history of alcohol use. The patient does not have a history of early familial atrial  fibrillation or other arrhythmias.  she has a BMI of Body mass index is 30.56 kg/m.Marland Kitchen Filed Weights   12/07/21 1409  Weight: 71.6 kg    Family History  Problem Relation Age of Onset   Hypertension Mother    Heart disease Mother    Heart disease Father    Colon cancer Neg Hx    Esophageal cancer Neg Hx    Rectal cancer Neg Hx    Stomach cancer Neg Hx      Atrial Fibrillation Management history:  Previous antiarrhythmic drugs: none Previous cardioversions: none  Previous ablations: none CHADS2VASC score: 3 Anticoagulation history: Eliquis   Past Medical History:  Diagnosis Date   Cystocele    Elevated cholesterol    Fibroid    Hypertension    Kidney stone    passed stone   Osteopenia    Perimenopausal vasomotor symptoms    Rectocele    SVD (spontaneous vaginal delivery)    x 2   Past Surgical History:  Procedure Laterality Date   ARM SURGERY  2005   AUGMENTATION MAMMAPLASTY     SALINE in 20's   BREAST SURGERY     augmentation in 20's   COLONOSCOPY     RECTOCELE REPAIR N/A 10/31/2013   Procedure: POSTERIOR REPAIR (RECTOCELE);  Surgeon: [name], MD;  Location: [facility];  Service: Gynecology;  Laterality: N/A;   right arm surgery     tendon repair   TONSILLECTOMY     TOTAL HIP ARTHROPLASTY  2001   RIGHT   TUBAL LIGATION     VAGINAL HYSTERECTOMY  1992   WISDOM TOOTH  EXTRACTION      Current Outpatient Medications  Medication Sig Dispense Refill   apixaban (ELIQUIS) 5 MG TABS tablet Take 1 tablet (5 mg total) by mouth 2 (two) times daily. Needs appt 200 tablet 1   b complex vitamins tablet Take 1 tablet by mouth daily.     Calcium Carbonate-Vitamin D (CALCIUM + D PO) Take 1 tablet by mouth daily.      chlorthalidone (HYGROTON) 25 MG tablet TAKE 1/2 TABLET BY MOUTH EVERY DAY IN THE MORNING     cholecalciferol (VITAMIN D) 1000 UNITS tablet Take 1,000 Units by mouth daily.      Coenzyme Q10 (COQ10 PO) Take 1 capsule by mouth daily.      diazepam  (VALIUM) 10 MG tablet TAKE 1 TABLET BY MOUTH EVERY 6 HOURS AS NEEDED FOR ANXIETY 30 tablet 2   hydrochlorothiazide (MICROZIDE) 12.5 MG capsule Take 1 capsule (12.5 mg total) by mouth every morning. 30 capsule 0   meloxicam (MOBIC) 15 MG tablet 1 tablet     metoprolol succinate (TOPROL-XL) 25 MG 24 hr tablet TAKE 1 TABLET BY MOUTH DAILY 180 tablet 3   rosuvastatin (CRESTOR) 20 MG tablet Take 20 mg by mouth daily.     sertraline (ZOLOFT) 100 MG tablet Take 100 mg by mouth daily.     No current facility-administered medications for this encounter.    Allergies  Allergen Reactions   Codeine Nausea And Vomiting   Prednisone Other (See Comments)    Social History   Socioeconomic History   Marital status: Married    Spouse name: Not on file   Number of children: Not on file   Years of education: Not on file   Highest education level: Not on file  Occupational History   Not on file  Tobacco Use   Smoking status: Some Days    Packs/day: 0.10    Types: Cigarettes   Smokeless tobacco: Never   Tobacco comments:    3 cigarettes daily 12/29/2020  Vaping Use   Vaping Use: Never used  Substance and Sexual Activity   Alcohol use: Yes    Alcohol/week: 3.0 standard drinks of alcohol    Types: 3 Glasses of wine per week    Comment: red wine x 3 nights per week 12/07/21   Drug use: No   Sexual activity: Not Currently    Birth control/protection: Surgical    Comment: 1st intercourse- 36, partners-20,  married- 28 yrs   Other Topics Concern   Not on file  Social History Narrative   Not on file   Social Determinants of Health   Financial Resource Strain: Not on file  Food Insecurity: Not on file  Transportation Needs: Not on file  Physical Activity: Not on file  Stress: Not on file  Social Connections: Not on file  Intimate Partner Violence: Not on file     ROS- All systems are reviewed and negative except as per the HPI above.  Physical Exam: Vitals:   12/07/21 1409  BP:  (!) 108/90  Pulse: 99  Weight: 71.6 kg  Height: 5' 0.25" (1.53 m)    GEN- The patient is a well appearing female, alert and oriented x 3 today.   HEENT-head normocephalic, atraumatic, sclera clear, conjunctiva pink, hearing intact, trachea midline. Lungs- Clear to ausculation bilaterally, normal work of breathing Heart- irregular rate and rhythm, no murmurs, rubs or gallops  GI- soft, NT, ND, + BS Extremities- no clubbing, cyanosis, or edema MS- no significant deformity or  atrophy Skin- no rash or lesion Psych- euthymic mood, full affect Neuro- strength and sensation are intact   Wt Readings from Last 3 Encounters:  12/07/21 71.6 kg  12/29/20 69.9 kg  09/20/19 71.2 kg    EKG today demonstrates  Afib Vent. rate 99 BPM PR interval * ms QRS duration 80 ms QT/QTcB 372/477 ms   Zio patch 08/18/18 Predominant rhythm is sinus rhythm Atrial Fibrillation is observed (3% burden) which likely explains palpitations with heart rates ranging from 110-216 bpm (avg of 161 bpm) The longest afib episode lated 2 hours 55 mins with an avg rate of 161 bpm.   Echo 08/29/18 1. The left ventricle has normal systolic function, with an ejection fraction of 55-60%. The cavity size was normal. Left ventricular diastolic Doppler parameters are consistent with impaired relaxation. No evidence of left ventricular regional wall motion abnormalities.  2. The right ventricle has normal systolic function. The cavity was normal. There is no increase in right ventricular wall thickness.  3. No evidence of mitral valve stenosis. Trivial mitral regurgitation.  4. The aortic valve is tricuspid. Aortic valve regurgitation is trivial by color flow Doppler. No stenosis of the aortic valve.  5. The aortic root is normal in size and structure.  6. Normal IVC size. No complete TR doppler jet so unable to estimate PA systolic pressure.   Epic records are reviewed at length today  CHA2DS2-VASc Score = 3  The patient's  score is based upon: CHF History: 0 HTN History: 1 Diabetes History: 0 Stroke History: 0 Vascular Disease History: 0 Age Score: 1 Gender Score: 1       ASSESSMENT AND PLAN: 1. Paroxysmal Atrial Fibrillation (ICD10:  I48.0) The patient's CHA2DS2-VASc score is 3, indicating a 3.2% annual risk of stroke.   Patient in afib today, unaware of her arrhythmia, unclear duration.  She has typically been symptomatic in the past. Will have her wear a Zio monitor for one week to evaluate paroxysmal vs persistent. If persistent, will arrange for DCCV after 3 weeks of uninterrupted anticoagulation. If paroxysmal, could consider AAD (flecainide, Multaq, amiodarone) or ablation. If dofetilide considered, would have to change several of her other medications.  Continue Toprol 25 mg daily Resume Eliquis 5 mg BID  2. Secondary Hypercoagulable State (ICD10:  D68.69) The patient is at significant risk for stroke/thromboembolism based upon her CHA2DS2-VASc Score of 3.  Continue Apixaban (Eliquis).   3. HTN Stable, no changes today.  4. Chest pain Patient having chest pain with some typical features. He has a history of tobacco use.  Will arrange for stress test since she is in afib. ED precautions given.   Follow up in the AF clinic in 2-3 weeks.     [facility] [address] [place], [place] [address] [phone] 12/07/2021 2:16 PM

## 2021-12-09 ENCOUNTER — Encounter (HOSPITAL_COMMUNITY): Payer: Self-pay | Admitting: Physician Assistant

## 2021-12-10 ENCOUNTER — Ambulatory Visit (HOSPITAL_COMMUNITY): Payer: Medicare Other | Attending: Physician Assistant

## 2021-12-10 DIAGNOSIS — I480 Paroxysmal atrial fibrillation: Secondary | ICD-10-CM

## 2021-12-10 LAB — MYOCARDIAL PERFUSION IMAGING
Base ST Depression (mm): 0 mm
LV dias vol: 65 mL (ref 46–106)
LV sys vol: 40 mL
Nuc Stress EF: 39 %
Peak HR: 139 {beats}/min
Rest HR: 114 {beats}/min
Rest Nuclear Isotope Dose: 10.1 mCi
SDS: 1
SRS: 0
SSS: 1
ST Depression (mm): 0 mm
Stress Nuclear Isotope Dose: 31.6 mCi
TID: 0.98

## 2021-12-10 MED ORDER — TECHNETIUM TC 99M TETROFOSMIN IV KIT
31.6000 | PACK | Freq: Once | INTRAVENOUS | Status: AC | PRN
  Administered 2021-12-10: 31.6 via INTRAVENOUS

## 2021-12-10 MED ORDER — REGADENOSON 0.4 MG/5ML IV SOLN
0.4000 mg | Freq: Once | INTRAVENOUS | Status: AC
  Administered 2021-12-10: 0.4 mg via INTRAVENOUS

## 2021-12-10 MED ORDER — TECHNETIUM TC 99M TETROFOSMIN IV KIT
10.1000 | PACK | Freq: Once | INTRAVENOUS | Status: AC | PRN
  Administered 2021-12-10: 10.1 via INTRAVENOUS

## 2021-12-11 ENCOUNTER — Other Ambulatory Visit (HOSPITAL_COMMUNITY): Payer: Self-pay | Admitting: *Deleted

## 2021-12-11 DIAGNOSIS — I480 Paroxysmal atrial fibrillation: Secondary | ICD-10-CM

## 2021-12-17 ENCOUNTER — Ambulatory Visit (HOSPITAL_COMMUNITY)
Admission: RE | Admit: 2021-12-17 | Discharge: 2021-12-17 | Disposition: A | Discharge status: Home / Self Care | Payer: Medicare Other | Source: Ambulatory Visit | Attending: Family Medicine | Admitting: Family Medicine

## 2021-12-17 DIAGNOSIS — I480 Paroxysmal atrial fibrillation: Secondary | ICD-10-CM | POA: Insufficient documentation

## 2021-12-17 DIAGNOSIS — I081 Rheumatic disorders of both mitral and tricuspid valves: Secondary | ICD-10-CM | POA: Diagnosis not present

## 2021-12-17 DIAGNOSIS — I10 Essential (primary) hypertension: Secondary | ICD-10-CM | POA: Diagnosis not present

## 2021-12-17 LAB — ECHOCARDIOGRAM COMPLETE
AR max vel: 2.49 cm2
AV Area VTI: 2.86 cm2
AV Area mean vel: 2.3 cm2
AV Mean grad: 1 mmHg
AV Peak grad: 2.7 mmHg
Ao pk vel: 0.82 m/s
Area-P 1/2: 5.97 cm2
Calc EF: 41.3 %
S' Lateral: 3.5 cm
Single Plane A2C EF: 40.8 %
Single Plane A4C EF: 42.7 %

## 2021-12-17 NOTE — Progress Notes (Signed)
*  PRELIMINARY RESULTS* Echocardiogram 2D Echocardiogram has been performed.  Kathy [name] Chevy Sweigert 12/17/2021, 10:11 AM

## 2021-12-21 DIAGNOSIS — I480 Paroxysmal atrial fibrillation: Secondary | ICD-10-CM | POA: Diagnosis not present

## 2021-12-28 ENCOUNTER — Ambulatory Visit (HOSPITAL_COMMUNITY)
Admission: RE | Admit: 2021-12-28 | Discharge: 2021-12-28 | Disposition: A | Discharge status: Home / Self Care | Payer: Medicare Other | Source: Ambulatory Visit | Attending: Physician Assistant | Admitting: Physician Assistant

## 2021-12-28 ENCOUNTER — Encounter (HOSPITAL_COMMUNITY): Payer: Self-pay | Admitting: Physician Assistant

## 2021-12-28 VITALS — BP 102/74 | HR 109 | Ht 60.0 in | Wt 157.2 lb

## 2021-12-28 DIAGNOSIS — D6869 Other thrombophilia: Secondary | ICD-10-CM | POA: Insufficient documentation

## 2021-12-28 DIAGNOSIS — I4819 Other persistent atrial fibrillation: Secondary | ICD-10-CM | POA: Diagnosis not present

## 2021-12-28 DIAGNOSIS — Z720 Tobacco use: Secondary | ICD-10-CM | POA: Diagnosis not present

## 2021-12-28 DIAGNOSIS — I5020 Unspecified systolic (congestive) heart failure: Secondary | ICD-10-CM | POA: Insufficient documentation

## 2021-12-28 DIAGNOSIS — Z7901 Long term (current) use of anticoagulants: Secondary | ICD-10-CM | POA: Diagnosis not present

## 2021-12-28 DIAGNOSIS — I110 Hypertensive heart disease with heart failure: Secondary | ICD-10-CM | POA: Diagnosis not present

## 2021-12-28 DIAGNOSIS — I480 Paroxysmal atrial fibrillation: Secondary | ICD-10-CM | POA: Diagnosis not present

## 2021-12-28 LAB — BASIC METABOLIC PANEL
Anion gap: 11 (ref 5–15)
BUN: 10 mg/dL (ref 8–23)
CO2: 28 mmol/L (ref 22–32)
Calcium: 10.6 mg/dL — ABNORMAL HIGH (ref 8.9–10.3)
Chloride: 100 mmol/L (ref 98–111)
Creatinine, Ser: 0.74 mg/dL (ref 0.44–1.00)
GFR, Estimated: >60 mL/min (ref >60)
Glucose, Bld: 139 mg/dL — ABNORMAL HIGH (ref 70–99)
Potassium: 4.2 mmol/L (ref 3.5–5.1)
Sodium: 139 mmol/L (ref 135–145)

## 2021-12-28 LAB — CBC
HCT: 48.8 % — ABNORMAL HIGH (ref 36.0–46.0)
Hemoglobin: 16.8 g/dL — ABNORMAL HIGH (ref 12.0–15.0)
MCH: 30.5 pg (ref 26.0–34.0)
MCHC: 34.4 g/dL (ref 30.0–36.0)
MCV: 88.7 fL (ref 80.0–100.0)
Platelets: 233 10*3/uL (ref 150–400)
RBC: 5.5 MIL/uL — ABNORMAL HIGH (ref 3.87–5.11)
RDW: 14.2 % (ref 11.5–15.5)
WBC: 5.5 10*3/uL (ref 4.0–10.5)
nRBC: 0 % (ref 0.0–0.2)

## 2021-12-28 NOTE — Patient Instructions (Signed)
Cardioversion scheduled for Monday, November 13th  - Arrive at the [facility] and go to admitting at [place] not eat or drink anything after midnight the night prior to your procedure.  - Take all your morning medication (except diabetic medications) with a sip of water prior to arrival.  - You will not be able to drive home after your procedure.  - Do NOT miss any doses of your blood thinner - if you should miss a dose please notify our office immediately.  - If you feel as if you go back into normal rhythm prior to scheduled cardioversion, please notify our office immediately. If your procedure is canceled in the cardioversion suite you will be charged a cancellation fee.

## 2021-12-28 NOTE — H&P (View-Only) (Signed)
Primary Care Physician: Orpah Melter, MD Primary Cardiologist: none Primary Electrophysiologist: none Referring Physician: Dr Clemmie Krill is a 71 y.o. female with a history of HTN, tobacco abuse, and atrial fibrillation who presents for follow up in the [facility].  The patient was initially diagnosed with atrial fibrillation on 08/18/18 after presenting with symptoms of palpitations and wearing a 3 day heart monitor which showed 3% AF burden. Patient reports that these palpitations had been going on for about one month prior to her diagnosis, about 2 episodes per week. She denies any specific triggers. She denies snoring or significant alcohol use. She is on Eliquis for a CHADS2VASC score of 3.  Patient presented for oral surgery today (have implant placed) and she was found to be in afib with mildly elevated heart rates. She was given labetalol at the dentist office and recommended to follow up here. She is unaware of her arrhythmia but did have some intermittent chest pain. Myoview did not show any ischemia but did show reduced EF. Echo also showed EF 40-45%, no focal wall motion abnormalities. Zio monitor showed 100% afib burden with average HR 108 bpm.   On follow up today, patient reports that she has done reasonably well since her last visit. She remains in afib with mildly elevated heart rates. No bleeding issues on anticoagulation.   Today, she denies symptoms of palpitations, shortness of breath, orthopnea, PND, lower extremity edema, dizziness, presyncope, syncope, snoring, daytime somnolence, bleeding, or neurologic sequela. The patient is tolerating medications without difficulties and is otherwise without complaint today.    Atrial Fibrillation Risk Factors:  she does not have symptoms or diagnosis of sleep apnea. she does not have a history of rheumatic fever. she does not have a history of alcohol use. The patient does not have a  history of early familial atrial fibrillation or other arrhythmias.  she has a BMI of Body mass index is 30.7 kg/m.Marland Kitchen Filed Weights   12/28/21 1319  Weight: 71.3 kg    Family History  Problem Relation Age of Onset   Hypertension Mother    Heart disease Mother    Heart disease Father    Colon cancer Neg Hx    Esophageal cancer Neg Hx    Rectal cancer Neg Hx    Stomach cancer Neg Hx      Atrial Fibrillation Management history:  Previous antiarrhythmic drugs: none Previous cardioversions: none  Previous ablations: none CHADS2VASC score: 4 Anticoagulation history: Eliquis   Past Medical History:  Diagnosis Date   Cystocele    Elevated cholesterol    Fibroid    Hypertension    Kidney stone    passed stone   Osteopenia    Perimenopausal vasomotor symptoms    Rectocele    SVD (spontaneous vaginal delivery)    x 2   Past Surgical History:  Procedure Laterality Date   ARM SURGERY  2005   AUGMENTATION MAMMAPLASTY     SALINE in 20's   BREAST SURGERY     augmentation in 20's   COLONOSCOPY     RECTOCELE REPAIR N/A 10/31/2013   Procedure: POSTERIOR REPAIR (RECTOCELE);  Surgeon: [name], MD;  Location: [facility];  Service: Gynecology;  Laterality: N/A;   right arm surgery     tendon repair   TONSILLECTOMY     TOTAL HIP ARTHROPLASTY  2001   RIGHT   TUBAL LIGATION     VAGINAL HYSTERECTOMY  1992  WISDOM TOOTH EXTRACTION      Current Outpatient Medications  Medication Sig Dispense Refill   apixaban (ELIQUIS) 5 MG TABS tablet Take 1 tablet (5 mg total) by mouth 2 (two) times daily. Needs appt 200 tablet 1   b complex vitamins tablet Take 1 tablet by mouth daily.     Calcium Carbonate-Vitamin D (CALCIUM + D PO) Take 1 tablet by mouth daily.      chlorthalidone (HYGROTON) 25 MG tablet TAKE 1/2 TABLET BY MOUTH EVERY DAY IN THE MORNING     cholecalciferol (VITAMIN D) 1000 UNITS tablet Take 1,000 Units by mouth daily.      Coenzyme Q10 (COQ10 PO) Take 1 capsule by  mouth daily.      diazepam (VALIUM) 10 MG tablet TAKE 1 TABLET BY MOUTH EVERY 6 HOURS AS NEEDED FOR ANXIETY 30 tablet 2   hydrochlorothiazide (MICROZIDE) 12.5 MG capsule Take 1 capsule (12.5 mg total) by mouth every morning. 30 capsule 0   meloxicam (MOBIC) 15 MG tablet 1 tablet     metoprolol succinate (TOPROL-XL) 25 MG 24 hr tablet TAKE 1 TABLET BY MOUTH DAILY 180 tablet 3   rosuvastatin (CRESTOR) 20 MG tablet Take 20 mg by mouth daily.     sertraline (ZOLOFT) 100 MG tablet Take 100 mg by mouth daily.     No current facility-administered medications for this encounter.    Allergies  Allergen Reactions   Codeine Nausea And Vomiting   Prednisone Other (See Comments)    Social History   Socioeconomic History   Marital status: Married    Spouse name: Not on file   Number of children: Not on file   Years of education: Not on file   Highest education level: Not on file  Occupational History   Not on file  Tobacco Use   Smoking status: Some Days    Packs/day: 0.10    Types: Cigarettes   Smokeless tobacco: Never   Tobacco comments:    3 cigarettes daily 12/29/2020  Vaping Use   Vaping Use: Never used  Substance and Sexual Activity   Alcohol use: Yes    Alcohol/week: 3.0 standard drinks of alcohol    Types: 3 Glasses of wine per week    Comment: red wine x 3 nights per week 12/07/21   Drug use: No   Sexual activity: Not Currently    Birth control/protection: Surgical    Comment: 1st intercourse- 73, partners-20,  married- 28 yrs   Other Topics Concern   Not on file  Social History Narrative   Not on file   Social Determinants of Health   Financial Resource Strain: Not on file  Food Insecurity: Not on file  Transportation Needs: Not on file  Physical Activity: Not on file  Stress: Not on file  Social Connections: Not on file  Intimate Partner Violence: Not on file     ROS- All systems are reviewed and negative except as per the HPI above.  Physical  Exam: Vitals:   12/28/21 1319  BP: 102/74  Pulse: (!) 109  Weight: 71.3 kg  Height: 5' (1.524 m)    GEN- The patient is a well appearing female, alert and oriented x 3 today.   HEENT-head normocephalic, atraumatic, sclera clear, conjunctiva pink, hearing intact, trachea midline. Lungs- Clear to ausculation bilaterally, normal work of breathing Heart- irregular rate and rhythm, no murmurs, rubs or gallops  GI- soft, NT, ND, + BS Extremities- no clubbing, cyanosis, or edema MS- no significant deformity  or atrophy Skin- no rash or lesion Psych- euthymic mood, full affect Neuro- strength and sensation are intact   Wt Readings from Last 3 Encounters:  12/28/21 71.3 kg  12/10/21 71.2 kg  12/07/21 71.6 kg    EKG today demonstrates  Afib Vent. rate 109 BPM PR interval * ms QRS duration 82 ms QT/QTcB 360/484 ms   Echo 12/17/21  1. Left ventricular ejection fraction, by estimation, is 40 to 45%. Left  ventricular ejection fraction by 2D MOD biplane is 41.3 %. The left  ventricle has mildly decreased function. The left ventricle demonstrates  global hypokinesis. Left ventricular diastolic function could not be evaluated.   2. Right ventricular systolic function is mildly reduced. The right  ventricular size is mildly enlarged. Tricuspid regurgitation signal is  inadequate for assessing PA pressure.   3. The mitral valve is grossly normal. Trivial mitral valve  regurgitation. No evidence of mitral stenosis.   4. The aortic valve was not well visualized. Aortic valve regurgitation  is not visualized. No aortic stenosis is present.   5. The inferior vena cava is dilated in size with >50% respiratory  variability, suggesting right atrial pressure of 8 mmHg.   Comparison(s): Changes from prior study are noted. The left ventricular  function is worsened.    Epic records are reviewed at length today  CHA2DS2-VASc Score = 4  The patient's score is based upon: CHF History:  1 HTN History: 1 Diabetes History: 0 Stroke History: 0 Vascular Disease History: 0 Age Score: 1 Gender Score: 1        ASSESSMENT AND PLAN: 1. Persistent atrial fibrillation  The patient's CHA2DS2-VASc score is 4, indicating a 4.8% annual risk of stroke.   Zio monitor showed 100% afib burden. We discussed rhythm control options today. Will plan for DCCV now that she has been back on her Eliquis for 3 weeks. Would avoid class IC and Multaq with reduced EF. Could consider dofetilide but would have to change several of her other medications. We also discussed ablation. Given her normal LA size and mildly reduced EF I think she may be a good candidate for ablation, patient agreeable to EP referral to discuss.  Continue Toprol 25 mg daily Continue Eliquis 5 mg BID  2. Secondary Hypercoagulable State (ICD10:  D68.69) The patient is at significant risk for stroke/thromboembolism based upon her CHA2DS2-VASc Score of 4.  Continue Apixaban (Eliquis).   3. HTN Stable, no changes today.  4. HFrEF EF 40-45% Suspect related to #1, stress test did not show any ischemia. Fluid status appears stable.    Follow up in the AF clinic post DCCV, also referred to EP to discuss ablation.     [facility] [address] [place], [place] [address] [phone] 12/28/2021 1:24 PM

## 2021-12-28 NOTE — Progress Notes (Signed)
Primary Care Physician: Orpah Melter, MD Primary Cardiologist: none Primary Electrophysiologist: none Referring Physician: Dr Clemmie Krill is a 71 y.o. female with a history of HTN, tobacco abuse, and atrial fibrillation who presents for follow up in the [facility].  The patient was initially diagnosed with atrial fibrillation on 08/18/18 after presenting with symptoms of palpitations and wearing a 3 day heart monitor which showed 3% AF burden. Patient reports that these palpitations had been going on for about one month prior to her diagnosis, about 2 episodes per week. She denies any specific triggers. She denies snoring or significant alcohol use. She is on Eliquis for a CHADS2VASC score of 3.  Patient presented for oral surgery today (have implant placed) and she was found to be in afib with mildly elevated heart rates. She was given labetalol at the dentist office and recommended to follow up here. She is unaware of her arrhythmia but did have some intermittent chest pain. Myoview did not show any ischemia but did show reduced EF. Echo also showed EF 40-45%, no focal wall motion abnormalities. Zio monitor showed 100% afib burden with average HR 108 bpm.   On follow up today, patient reports that she has done reasonably well since her last visit. She remains in afib with mildly elevated heart rates. No bleeding issues on anticoagulation.   Today, she denies symptoms of palpitations, shortness of breath, orthopnea, PND, lower extremity edema, dizziness, presyncope, syncope, snoring, daytime somnolence, bleeding, or neurologic sequela. The patient is tolerating medications without difficulties and is otherwise without complaint today.    Atrial Fibrillation Risk Factors:  she does not have symptoms or diagnosis of sleep apnea. she does not have a history of rheumatic fever. she does not have a history of alcohol use. The patient does not have a  history of early familial atrial fibrillation or other arrhythmias.  she has a BMI of Body mass index is 30.7 kg/m.Marland Kitchen Filed Weights   12/28/21 1319  Weight: 71.3 kg    Family History  Problem Relation Age of Onset   Hypertension Mother    Heart disease Mother    Heart disease Father    Colon cancer Neg Hx    Esophageal cancer Neg Hx    Rectal cancer Neg Hx    Stomach cancer Neg Hx      Atrial Fibrillation Management history:  Previous antiarrhythmic drugs: none Previous cardioversions: none  Previous ablations: none CHADS2VASC score: 4 Anticoagulation history: Eliquis   Past Medical History:  Diagnosis Date   Cystocele    Elevated cholesterol    Fibroid    Hypertension    Kidney stone    passed stone   Osteopenia    Perimenopausal vasomotor symptoms    Rectocele    SVD (spontaneous vaginal delivery)    x 2   Past Surgical History:  Procedure Laterality Date   ARM SURGERY  2005   AUGMENTATION MAMMAPLASTY     SALINE in 20's   BREAST SURGERY     augmentation in 20's   COLONOSCOPY     RECTOCELE REPAIR N/A 10/31/2013   Procedure: POSTERIOR REPAIR (RECTOCELE);  Surgeon: [name], MD;  Location: [facility];  Service: Gynecology;  Laterality: N/A;   right arm surgery     tendon repair   TONSILLECTOMY     TOTAL HIP ARTHROPLASTY  2001   RIGHT   TUBAL LIGATION     VAGINAL HYSTERECTOMY  1992  WISDOM TOOTH EXTRACTION      Current Outpatient Medications  Medication Sig Dispense Refill   apixaban (ELIQUIS) 5 MG TABS tablet Take 1 tablet (5 mg total) by mouth 2 (two) times daily. Needs appt 200 tablet 1   b complex vitamins tablet Take 1 tablet by mouth daily.     Calcium Carbonate-Vitamin D (CALCIUM + D PO) Take 1 tablet by mouth daily.      chlorthalidone (HYGROTON) 25 MG tablet TAKE 1/2 TABLET BY MOUTH EVERY DAY IN THE MORNING     cholecalciferol (VITAMIN D) 1000 UNITS tablet Take 1,000 Units by mouth daily.      Coenzyme Q10 (COQ10 PO) Take 1 capsule by  mouth daily.      diazepam (VALIUM) 10 MG tablet TAKE 1 TABLET BY MOUTH EVERY 6 HOURS AS NEEDED FOR ANXIETY 30 tablet 2   hydrochlorothiazide (MICROZIDE) 12.5 MG capsule Take 1 capsule (12.5 mg total) by mouth every morning. 30 capsule 0   meloxicam (MOBIC) 15 MG tablet 1 tablet     metoprolol succinate (TOPROL-XL) 25 MG 24 hr tablet TAKE 1 TABLET BY MOUTH DAILY 180 tablet 3   rosuvastatin (CRESTOR) 20 MG tablet Take 20 mg by mouth daily.     sertraline (ZOLOFT) 100 MG tablet Take 100 mg by mouth daily.     No current facility-administered medications for this encounter.    Allergies  Allergen Reactions   Codeine Nausea And Vomiting   Prednisone Other (See Comments)    Social History   Socioeconomic History   Marital status: Married    Spouse name: Not on file   Number of children: Not on file   Years of education: Not on file   Highest education level: Not on file  Occupational History   Not on file  Tobacco Use   Smoking status: Some Days    Packs/day: 0.10    Types: Cigarettes   Smokeless tobacco: Never   Tobacco comments:    3 cigarettes daily 12/29/2020  Vaping Use   Vaping Use: Never used  Substance and Sexual Activity   Alcohol use: Yes    Alcohol/week: 3.0 standard drinks of alcohol    Types: 3 Glasses of wine per week    Comment: red wine x 3 nights per week 12/07/21   Drug use: No   Sexual activity: Not Currently    Birth control/protection: Surgical    Comment: 1st intercourse- 51, partners-20,  married- 28 yrs   Other Topics Concern   Not on file  Social History Narrative   Not on file   Social Determinants of Health   Financial Resource Strain: Not on file  Food Insecurity: Not on file  Transportation Needs: Not on file  Physical Activity: Not on file  Stress: Not on file  Social Connections: Not on file  Intimate Partner Violence: Not on file     ROS- All systems are reviewed and negative except as per the HPI above.  Physical  Exam: Vitals:   12/28/21 1319  BP: 102/74  Pulse: (!) 109  Weight: 71.3 kg  Height: 5' (1.524 m)    GEN- The patient is a well appearing female, alert and oriented x 3 today.   HEENT-head normocephalic, atraumatic, sclera clear, conjunctiva pink, hearing intact, trachea midline. Lungs- Clear to ausculation bilaterally, normal work of breathing Heart- irregular rate and rhythm, no murmurs, rubs or gallops  GI- soft, NT, ND, + BS Extremities- no clubbing, cyanosis, or edema MS- no significant deformity  or atrophy Skin- no rash or lesion Psych- euthymic mood, full affect Neuro- strength and sensation are intact   Wt Readings from Last 3 Encounters:  12/28/21 71.3 kg  12/10/21 71.2 kg  12/07/21 71.6 kg    EKG today demonstrates  Afib Vent. rate 109 BPM PR interval * ms QRS duration 82 ms QT/QTcB 360/484 ms   Echo 12/17/21  1. Left ventricular ejection fraction, by estimation, is 40 to 45%. Left  ventricular ejection fraction by 2D MOD biplane is 41.3 %. The left  ventricle has mildly decreased function. The left ventricle demonstrates  global hypokinesis. Left ventricular diastolic function could not be evaluated.   2. Right ventricular systolic function is mildly reduced. The right  ventricular size is mildly enlarged. Tricuspid regurgitation signal is  inadequate for assessing PA pressure.   3. The mitral valve is grossly normal. Trivial mitral valve  regurgitation. No evidence of mitral stenosis.   4. The aortic valve was not well visualized. Aortic valve regurgitation  is not visualized. No aortic stenosis is present.   5. The inferior vena cava is dilated in size with >50% respiratory  variability, suggesting right atrial pressure of 8 mmHg.   Comparison(s): Changes from prior study are noted. The left ventricular  function is worsened.    Epic records are reviewed at length today  CHA2DS2-VASc Score = 4  The patient's score is based upon: CHF History:  1 HTN History: 1 Diabetes History: 0 Stroke History: 0 Vascular Disease History: 0 Age Score: 1 Gender Score: 1        ASSESSMENT AND PLAN: 1. Persistent atrial fibrillation  The patient's CHA2DS2-VASc score is 4, indicating a 4.8% annual risk of stroke.   Zio monitor showed 100% afib burden. We discussed rhythm control options today. Will plan for DCCV now that she has been back on her Eliquis for 3 weeks. Would avoid class IC and Multaq with reduced EF. Could consider dofetilide but would have to change several of her other medications. We also discussed ablation. Given her normal LA size and mildly reduced EF I think she may be a good candidate for ablation, patient agreeable to EP referral to discuss.  Continue Toprol 25 mg daily Continue Eliquis 5 mg BID  2. Secondary Hypercoagulable State (ICD10:  D68.69) The patient is at significant risk for stroke/thromboembolism based upon her CHA2DS2-VASc Score of 4.  Continue Apixaban (Eliquis).   3. HTN Stable, no changes today.  4. HFrEF EF 40-45% Suspect related to #1, stress test did not show any ischemia. Fluid status appears stable.    Follow up in the AF clinic post DCCV, also referred to EP to discuss ablation.     [facility] [address] [place], [place] [address] [phone] 12/28/2021 1:24 PM

## 2022-01-04 ENCOUNTER — Ambulatory Visit (HOSPITAL_COMMUNITY): Payer: Medicare Other | Admitting: Certified Registered"

## 2022-01-04 ENCOUNTER — Ambulatory Visit (HOSPITAL_BASED_OUTPATIENT_CLINIC_OR_DEPARTMENT_OTHER): Payer: Medicare Other | Admitting: Certified Registered"

## 2022-01-04 ENCOUNTER — Encounter (HOSPITAL_COMMUNITY)
Admission: RE | Disposition: A | Discharge status: Home / Self Care | Payer: Self-pay | Source: Home / Self Care | Attending: Cardiology

## 2022-01-04 ENCOUNTER — Other Ambulatory Visit: Payer: Self-pay

## 2022-01-04 ENCOUNTER — Ambulatory Visit (HOSPITAL_COMMUNITY)
Admission: RE | Admit: 2022-01-04 | Discharge: 2022-01-04 | Disposition: A | Discharge status: Home / Self Care | Payer: Medicare Other | Attending: Cardiology | Admitting: Cardiology

## 2022-01-04 ENCOUNTER — Encounter (HOSPITAL_COMMUNITY): Payer: Self-pay | Admitting: Cardiology

## 2022-01-04 DIAGNOSIS — I5020 Unspecified systolic (congestive) heart failure: Secondary | ICD-10-CM | POA: Diagnosis not present

## 2022-01-04 DIAGNOSIS — N289 Disorder of kidney and ureter, unspecified: Secondary | ICD-10-CM

## 2022-01-04 DIAGNOSIS — Z7901 Long term (current) use of anticoagulants: Secondary | ICD-10-CM | POA: Insufficient documentation

## 2022-01-04 DIAGNOSIS — I4891 Unspecified atrial fibrillation: Secondary | ICD-10-CM

## 2022-01-04 DIAGNOSIS — I10 Essential (primary) hypertension: Secondary | ICD-10-CM

## 2022-01-04 DIAGNOSIS — F17210 Nicotine dependence, cigarettes, uncomplicated: Secondary | ICD-10-CM

## 2022-01-04 DIAGNOSIS — I4819 Other persistent atrial fibrillation: Secondary | ICD-10-CM | POA: Diagnosis not present

## 2022-01-04 DIAGNOSIS — F17200 Nicotine dependence, unspecified, uncomplicated: Secondary | ICD-10-CM | POA: Diagnosis not present

## 2022-01-04 DIAGNOSIS — I110 Hypertensive heart disease with heart failure: Secondary | ICD-10-CM | POA: Diagnosis not present

## 2022-01-04 HISTORY — PX: CARDIOVERSION: SHX1299

## 2022-01-04 SURGERY — CARDIOVERSION
Anesthesia: General

## 2022-01-04 MED ORDER — SODIUM CHLORIDE 0.9 % IV SOLN: INTRAVENOUS | Status: DC

## 2022-01-04 MED ORDER — PROPOFOL 10 MG/ML IV BOLUS
INTRAVENOUS | Status: DC | PRN
  Administered 2022-01-04: 60 mg via INTRAVENOUS

## 2022-01-04 MED ORDER — LIDOCAINE HCL (CARDIAC) PF 100 MG/5ML IV SOSY
PREFILLED_SYRINGE | INTRAVENOUS | Status: DC | PRN
  Administered 2022-01-04: 20 mg via INTRAVENOUS

## 2022-01-04 NOTE — Anesthesia Preprocedure Evaluation (Signed)
Anesthesia Evaluation  Patient identified by MRN, date of birth, ID band Patient awake    Reviewed: Allergy & Precautions, NPO status , Patient's Chart, lab work & pertinent test results  Airway Mallampati: I  TM Distance: >3 FB     Dental  (+) Dental Advisory Given   Pulmonary Current Smoker   breath sounds clear to auscultation       Cardiovascular hypertension, Pt. on medications  Rhythm:Regular Rate:Normal     Neuro/Psych negative neurological ROS     GI/Hepatic negative GI ROS, Neg liver ROS,,,  Endo/Other  negative endocrine ROS    Renal/GU Renal disease     Musculoskeletal   Abdominal   Peds  Hematology negative hematology ROS (+)   Anesthesia Other Findings   Reproductive/Obstetrics                             Anesthesia Physical Anesthesia Plan  ASA: 3  Anesthesia Plan: General   Post-op Pain Management:    Induction: Intravenous  PONV Risk Score and Plan: 2 and Treatment may vary due to age or medical condition  Airway Management Planned: Mask, Natural Airway and Nasal Cannula  Additional Equipment:   Intra-op Plan:   Post-operative Plan:   Informed Consent: I have reviewed the patients History and Physical, chart, labs and discussed the procedure including the risks, benefits and alternatives for the proposed anesthesia with the patient or authorized representative who has indicated his/her understanding and acceptance.     Dental advisory given  Plan Discussed with: CRNA  Anesthesia Plan Comments:        Anesthesia Quick Evaluation

## 2022-01-04 NOTE — Transfer of Care (Signed)
Immediate Anesthesia Transfer of Care Note  Patient: Kathy [name]  Procedure(s) Performed: CARDIOVERSION  Patient Location: PACU  Anesthesia Type:General  Level of Consciousness: drowsy  Airway & Oxygen Therapy: Patient Spontanous Breathing and Patient connected to nasal cannula oxygen  Post-op Assessment: Report given to RN and Post -op Vital signs reviewed and stable  Post vital signs: Reviewed and stable  Last Vitals:  Vitals Value Taken Time  BP    Temp    Pulse    Resp    SpO2      Last Pain:  Vitals:   01/04/22 0927  PainSc: 0-No pain         Complications: No notable events documented.

## 2022-01-04 NOTE — Discharge Instructions (Signed)

## 2022-01-04 NOTE — CV Procedure (Signed)
    Electrical Cardioversion Procedure Note Kathy [name] [id number] 24-Feb-1950  Procedure: Electrical Cardioversion Indications:  Atrial Fibrillation  Time Out: Verified patient identification, verified procedure,medications/allergies/relevent history reviewed, required imaging and test results available.  Performed  Procedure Details  The patient was NPO after midnight. Anesthesia was administered at the beside  by Dr.Fitzgerald with '20mg'$  of Lidocaine and 60 mg of propofol.  Cardioversion was done with synchronized biphasic defibrillation with AP pads with 150watts.  The patient converted to normal sinus rhythm. The patient tolerated the procedure well   IMPRESSION:  Successful cardioversion of atrial fibrillation    Kathy [name] 01/04/2022, 9:18 AM

## 2022-01-04 NOTE — Interval H&P Note (Signed)
History and Physical Interval Note:  01/04/2022 9:17 AM  Kathy [name]  has presented today for surgery, with the diagnosis of atrial fibrillation.  The various methods of treatment have been discussed with the patient and family. After consideration of risks, benefits and other options for treatment, the patient has consented to  Procedure(s): CARDIOVERSION (N/A) as a surgical intervention.  The patient's history has been reviewed, patient examined, no change in status, stable for surgery.  I have reviewed the patient's chart and labs.  Questions were answered to the patient's satisfaction.     Fransico Him

## 2022-01-05 ENCOUNTER — Encounter (HOSPITAL_COMMUNITY): Payer: Self-pay | Admitting: Cardiology

## 2022-01-05 NOTE — Anesthesia Postprocedure Evaluation (Signed)
Anesthesia Post Note  Patient: Kathy [name]  Procedure(s) Performed: CARDIOVERSION     Patient location during evaluation: PACU Anesthesia Type: General Level of consciousness: awake and alert Pain management: pain level controlled Vital Signs Assessment: post-procedure vital signs reviewed and stable Respiratory status: spontaneous breathing, nonlabored ventilation, respiratory function stable and patient connected to nasal cannula oxygen Cardiovascular status: blood pressure returned to baseline and stable Postop Assessment: no apparent nausea or vomiting Anesthetic complications: no   No notable events documented.  Last Vitals:  Vitals:   01/04/22 1006 01/04/22 1020  BP: 103/72 110/75  Pulse: 75 71  Resp: 17 19  SpO2: 95% 96%    Last Pain:  Vitals:   01/05/22 1331  PainSc: 0-No pain                 Tiajuana Amass

## 2022-01-11 ENCOUNTER — Ambulatory Visit (HOSPITAL_COMMUNITY)
Admission: RE | Admit: 2022-01-11 | Discharge: 2022-01-11 | Disposition: A | Discharge status: Home / Self Care | Payer: Medicare Other | Source: Ambulatory Visit | Attending: Physician Assistant | Admitting: Physician Assistant

## 2022-01-11 VITALS — BP 132/80 | HR 68 | Ht 60.0 in | Wt 158.2 lb

## 2022-01-11 DIAGNOSIS — Z79899 Other long term (current) drug therapy: Secondary | ICD-10-CM | POA: Diagnosis not present

## 2022-01-11 DIAGNOSIS — Z7901 Long term (current) use of anticoagulants: Secondary | ICD-10-CM | POA: Diagnosis not present

## 2022-01-11 DIAGNOSIS — I5020 Unspecified systolic (congestive) heart failure: Secondary | ICD-10-CM | POA: Insufficient documentation

## 2022-01-11 DIAGNOSIS — I4819 Other persistent atrial fibrillation: Secondary | ICD-10-CM | POA: Insufficient documentation

## 2022-01-11 DIAGNOSIS — Z8249 Family history of ischemic heart disease and other diseases of the circulatory system: Secondary | ICD-10-CM | POA: Diagnosis not present

## 2022-01-11 DIAGNOSIS — I110 Hypertensive heart disease with heart failure: Secondary | ICD-10-CM | POA: Diagnosis not present

## 2022-01-11 DIAGNOSIS — F17210 Nicotine dependence, cigarettes, uncomplicated: Secondary | ICD-10-CM | POA: Insufficient documentation

## 2022-01-11 DIAGNOSIS — D6869 Other thrombophilia: Secondary | ICD-10-CM | POA: Diagnosis not present

## 2022-01-11 NOTE — Progress Notes (Signed)
Primary Care Physician: Orpah Melter, MD Primary Cardiologist: none Primary Electrophysiologist: Dr Curt Bears (new) Referring Physician: Dr Rayann Heman   Kathy [name] is a 71 y.o. female with a history of HTN, tobacco abuse, and atrial fibrillation who presents for follow up in the [facility].  The patient was initially diagnosed with atrial fibrillation on 08/18/18 after presenting with symptoms of palpitations and wearing a 3 day heart monitor which showed 3% AF burden. Patient reports that these palpitations had been going on for about one month prior to her diagnosis, about 2 episodes per week. She denies any specific triggers. She denies snoring or significant alcohol use. She is on Eliquis for a CHADS2VASC score of 3.  Patient presented for oral surgery today (have implant placed) and she was found to be in afib with mildly elevated heart rates. She was given labetalol at the dentist office and recommended to follow up here. She is unaware of her arrhythmia but did have some intermittent chest pain. Myoview did not show any ischemia but did show reduced EF. Echo also showed EF 40-45%, no focal wall motion abnormalities. Zio monitor showed 100% afib burden with average HR 108 bpm.   On follow up today, patient is s/p DCCV on 01/04/22. Patient remains in [place] today. She does feel that she has a little more energy in SR but his not back to her baseline yet. No bleeding issues on anticoagulation.   Today, she denies symptoms of palpitations, shortness of breath, orthopnea, PND, lower extremity edema, dizziness, presyncope, syncope, snoring, daytime somnolence, bleeding, or neurologic sequela. The patient is tolerating medications without difficulties and is otherwise without complaint today.    Atrial Fibrillation Risk Factors:  she does not have symptoms or diagnosis of sleep apnea. she does not have a history of rheumatic fever. she does not have a history of  alcohol use. The patient does not have a history of early familial atrial fibrillation or other arrhythmias.  she has a BMI of Body mass index is 30.9 kg/m.Marland Kitchen Filed Weights   01/11/22 1336  Weight: 71.8 kg    Family History  Problem Relation Age of Onset   Hypertension Mother    Heart disease Mother    Heart disease Father    Colon cancer Neg Hx    Esophageal cancer Neg Hx    Rectal cancer Neg Hx    Stomach cancer Neg Hx      Atrial Fibrillation Management history:  Previous antiarrhythmic drugs: none Previous cardioversions: 01/04/22 Previous ablations: none CHADS2VASC score: 4 Anticoagulation history: Eliquis   Past Medical History:  Diagnosis Date   Cystocele    Elevated cholesterol    Fibroid    Hypertension    Kidney stone    passed stone   Osteopenia    Perimenopausal vasomotor symptoms    Rectocele    SVD (spontaneous vaginal delivery)    x 2   Past Surgical History:  Procedure Laterality Date   ARM SURGERY  2005   AUGMENTATION MAMMAPLASTY     SALINE in 20's   BREAST SURGERY     augmentation in 20's   CARDIOVERSION N/A 01/04/2022   Procedure: CARDIOVERSION;  Surgeon: Sueanne Margarita, MD;  Location: [facility];  Service: Cardiovascular;  Laterality: N/A;   COLONOSCOPY     RECTOCELE REPAIR N/A 10/31/2013   Procedure: POSTERIOR REPAIR (RECTOCELE);  Surgeon: [name], MD;  Location: [facility];  Service: Gynecology;  Laterality: N/A;   right  arm surgery     tendon repair   TONSILLECTOMY     TOTAL HIP ARTHROPLASTY  2001   RIGHT   TUBAL LIGATION     VAGINAL HYSTERECTOMY  1992   WISDOM TOOTH EXTRACTION      Current Outpatient Medications  Medication Sig Dispense Refill   apixaban (ELIQUIS) 5 MG TABS tablet Take 1 tablet (5 mg total) by mouth 2 (two) times daily. Needs appt 200 tablet 1   Calcium Carbonate-Vitamin D (CALCIUM + D PO) Take 600 mg by mouth 2 (two) times daily.     chlorthalidone (HYGROTON) 25 MG tablet TAKE 1/2 TABLET BY MOUTH  EVERY DAY IN THE MORNING     Cholecalciferol (VITAMIN D-3) 125 MCG (5000 UT) TABS Take 5,000 Units by mouth daily.     Coenzyme Q10 (COQ10) 200 MG CAPS Take 200 mg by mouth daily.     diazepam (VALIUM) 10 MG tablet TAKE 1 TABLET BY MOUTH EVERY 6 HOURS AS NEEDED FOR ANXIETY 30 tablet 2   metoprolol succinate (TOPROL-XL) 25 MG 24 hr tablet TAKE 1 TABLET BY MOUTH DAILY 180 tablet 3   rosuvastatin (CRESTOR) 20 MG tablet Take 20 mg by mouth at bedtime.     sertraline (ZOLOFT) 100 MG tablet Take 100 mg by mouth daily.     No current facility-administered medications for this encounter.    Allergies  Allergen Reactions   Codeine Nausea And Vomiting   Prednisone Palpitations    Social History   Socioeconomic History   Marital status: Married    Spouse name: Not on file   Number of children: Not on file   Years of education: Not on file   Highest education level: Not on file  Occupational History   Not on file  Tobacco Use   Smoking status: Some Days    Packs/day: 0.10    Types: Cigarettes   Smokeless tobacco: Never   Tobacco comments:    3 cigarettes daily 12/29/2020  Vaping Use   Vaping Use: Never used  Substance and Sexual Activity   Alcohol use: Yes    Alcohol/week: 3.0 standard drinks of alcohol    Types: 3 Glasses of wine per week    Comment: red wine x 3 nights per week 12/07/21   Drug use: No   Sexual activity: Not Currently    Birth control/protection: Surgical    Comment: 1st intercourse- 62, partners-20,  married- 28 yrs   Other Topics Concern   Not on file  Social History Narrative   Not on file   Social Determinants of Health   Financial Resource Strain: Not on file  Food Insecurity: Not on file  Transportation Needs: Not on file  Physical Activity: Not on file  Stress: Not on file  Social Connections: Not on file  Intimate Partner Violence: Not on file     ROS- All systems are reviewed and negative except as per the HPI above.  Physical  Exam: Vitals:   01/11/22 1336  BP: 132/80  Pulse: 68  Weight: 71.8 kg  Height: 5' (1.524 m)     GEN- The patient is a well appearing female, alert and oriented x 3 today.   HEENT-head normocephalic, atraumatic, sclera clear, conjunctiva pink, hearing intact, trachea midline. Lungs- Clear to ausculation bilaterally, normal work of breathing Heart- Regular rate and rhythm, no murmurs, rubs or gallops  GI- soft, NT, ND, + BS Extremities- no clubbing, cyanosis, or edema MS- no significant deformity or atrophy Skin- no rash  or lesion Psych- euthymic mood, full affect Neuro- strength and sensation are intact   Wt Readings from Last 3 Encounters:  01/11/22 71.8 kg  01/04/22 68 kg  12/28/21 71.3 kg    EKG today demonstrates  SR, 1st degree AV block, LAFB Vent. rate 68 BPM PR interval 216 ms QRS duration 84 ms QT/QTcB 450/478 ms   Echo 12/17/21  1. Left ventricular ejection fraction, by estimation, is 40 to 45%. Left  ventricular ejection fraction by 2D MOD biplane is 41.3 %. The left  ventricle has mildly decreased function. The left ventricle demonstrates  global hypokinesis. Left ventricular diastolic function could not be evaluated.   2. Right ventricular systolic function is mildly reduced. The right  ventricular size is mildly enlarged. Tricuspid regurgitation signal is  inadequate for assessing PA pressure.   3. The mitral valve is grossly normal. Trivial mitral valve  regurgitation. No evidence of mitral stenosis.   4. The aortic valve was not well visualized. Aortic valve regurgitation  is not visualized. No aortic stenosis is present.   5. The inferior vena cava is dilated in size with >50% respiratory  variability, suggesting right atrial pressure of 8 mmHg.   Comparison(s): Changes from prior study are noted. The left ventricular  function is worsened.    Epic records are reviewed at length today  CHA2DS2-VASc Score = 4  The patient's score is based  upon: CHF History: 1 HTN History: 1 Diabetes History: 0 Stroke History: 0 Vascular Disease History: 0 Age Score: 1 Gender Score: 1       ASSESSMENT AND PLAN: 1. Persistent atrial fibrillation  The patient's CHA2DS2-VASc score is 4, indicating a 4.8% annual risk of stroke.   Zio monitor showed 100% afib burden. S/p DCCV on 01/04/22 Patient back in SR.  Would avoid class IC and Multaq with reduced EF. Could consider dofetilide but would have to change several of her other medications. We also discussed ablation. Given her normal LA size and mildly reduced EF I think she may be a good candidate for ablation. She has an appointment with Dr Curt Bears to discuss.  Continue Toprol 25 mg daily Continue Eliquis 5 mg BID  2. Secondary Hypercoagulable State (ICD10:  D68.69) The patient is at significant risk for stroke/thromboembolism based upon her CHA2DS2-VASc Score of 4.  Continue Apixaban (Eliquis).   3. HTN Stable, no changes today.  4. HFrEF EF 40-45% Suspect related to afib, stress test did not show any ischemia. Appears euvolemic today.   Follow up with Dr Curt Bears as scheduled.     [facility] [address] [place], [place] [address] [phone] 01/11/2022 2:22 PM

## 2022-01-26 DIAGNOSIS — M25531 Pain in right wrist: Secondary | ICD-10-CM | POA: Diagnosis not present

## 2022-01-26 DIAGNOSIS — M79641 Pain in right hand: Secondary | ICD-10-CM | POA: Diagnosis not present

## 2022-01-27 ENCOUNTER — Telehealth: Payer: Self-pay | Admitting: *Deleted

## 2022-01-27 NOTE — Telephone Encounter (Signed)
   Pre-operative Risk Assessment    Patient Name: Kathy [name]  DOB: Apr 17, 1950 MRN: [id number]    PT HAS APPT WITH DR. Curt Bears 02/02/22 Request for Surgical Clearance    Procedure:  ONE  DENTAL IMPLANT PLACEMENT  Date of Surgery:  Clearance TBD                                 Surgeon:  DR. Arnetha Courser Surgeon's Group or Practice Name:  [facility] Phone number:  [phone] Fax number:  [phone]   Type of Clearance Requested:   - Medical  - Pharmacy:  Hold Apixaban (Eliquis) PER DENTAL CLEARANCE FORM: WE ARE AWARE THE PT CANNOT INTERRUPT [place] AFTER 02/04/22. HOW SOON AFTER 02/04/22 CAN SHE PROCEED WITH THE ABOVE PROCEDURE?   Type of Anesthesia:  IV SEDATION USING LOCAL CONTAINING EPINEPHRINE   Additional requests/questions:    [name]   01/27/2022, 5:55 PM

## 2022-01-29 NOTE — Telephone Encounter (Signed)
Sees Dr Curt Bears 12/12 to discuss potential ablation. Will need to wait until after that visit to provide anticoagulation recommendations.

## 2022-02-02 ENCOUNTER — Encounter: Payer: Self-pay | Admitting: *Deleted

## 2022-02-02 ENCOUNTER — Ambulatory Visit: Payer: Medicare Other | Attending: Cardiology | Admitting: Cardiology

## 2022-02-02 ENCOUNTER — Encounter: Payer: Self-pay | Admitting: Cardiology

## 2022-02-02 VITALS — BP 120/78 | HR 70 | Ht 60.0 in | Wt 164.0 lb

## 2022-02-02 DIAGNOSIS — D6869 Other thrombophilia: Secondary | ICD-10-CM | POA: Diagnosis not present

## 2022-02-02 DIAGNOSIS — I10 Essential (primary) hypertension: Secondary | ICD-10-CM

## 2022-02-02 DIAGNOSIS — I4819 Other persistent atrial fibrillation: Secondary | ICD-10-CM

## 2022-02-02 MED ORDER — AMIODARONE HCL 200 MG PO TABS: ORAL_TABLET | ORAL | 2 refills | Status: DC

## 2022-02-02 NOTE — Patient Instructions (Addendum)
Medication Instructions:  Your physician has recommended you make the following change in your medication:   START Amiodarone  - take 2 tablets (400 mg total) TWICE a day for 2 weeks, then  - take 1 tablet (200 mg total) TWICE a day for 2 weeks, then  - take 1 tablet (200 mg total) ONCE a day   *If you need a refill on your cardiac medications before your next appointment, please call your pharmacy*   Lab Work: Pre procedure labs -- see procedure instruction letter:  BMP & CBC  If you have labs (blood work) drawn today and your tests are completely normal, you will receive your results only by: MyChart Message (if you have MyChart) OR A paper copy in the mail If you have any lab test that is abnormal or we need to change your treatment, we will call you to review the results.   Testing/Procedures: Your physician has recommended that you have a Cardioversion (DCCV). Electrical Cardioversion uses a jolt of electricity to your heart either through paddles or wired patches attached to your chest. This is a controlled, usually prescheduled, procedure. Defibrillation is done under light anesthesia in the hospital, and you usually go home the day of the procedure. This is done to get your heart back into a normal rhythm. You are not awake for the procedure. Please see the instruction sheet given to you today.  Your physician has requested that you have cardiac CT within 7 days PRIOR to your ablation. Cardiac computed tomography (CT) is a painless test that uses an x-ray machine to take clear, detailed pictures of your heart.  Please follow instruction below located under "other instructions". You will get a call from our office to schedule the date for this test.  Your physician has recommended that you have an ablation. Catheter ablation is a medical procedure used to treat some cardiac arrhythmias (irregular heartbeats). During catheter ablation, a long, thin, flexible tube is put into a blood  vessel in your groin (upper thigh), or neck. This tube is called an ablation catheter. It is then guided to your heart through the blood vessel. Radio frequency waves destroy small areas of heart tissue where abnormal heartbeats may cause an arrhythmia to start.  You are scheduled for 06/23/2022  Someone will contact you to review CT and ablation instructions.   Follow-Up: At [facility], you and your health needs are our priority.  As part of our continuing mission to provide you with exceptional heart care, we have created designated Provider Care Teams.  These Care Teams include your primary Cardiologist (physician) and Advanced Practice Providers (APPs -  Physician Assistants and Nurse Practitioners) who all work together to provide you with the care you need, when you need it.   You are scheduled to see Tommye Standard, PA on 06/01/2022 @ 9:15 am  for H&P and pre procedure labs  (you do NOT need to be fasting)   Your next appointment:   1 month(s) after your ablation  The format for your next appointment:   In Person  Provider:   AFib clinic   Thank you for choosing [facility]!!   Trinidad Curet, RN [phone]    Other Instructions   Cardiac Ablation Cardiac ablation is a procedure to destroy (ablate) some heart tissue that is sending bad signals. These bad signals cause problems in heart rhythm. The heart has many areas that make these signals. If there are problems in these areas, they can make  the heart beat in a way that is not normal. Destroying some tissues can help make the heart rhythm normal. Tell your doctor about: Any allergies you have. All medicines you are taking. These include vitamins, herbs, eye drops, creams, and over-the-counter medicines. Any problems you or family members have had with medicines that make you fall asleep (anesthetics). Any blood disorders you have. Any surgeries you have had. Any medical conditions you have, such as kidney  failure. Whether you are pregnant or may be pregnant. What are the risks? This is a safe procedure. But problems may occur, including: Infection. Bruising and bleeding. Bleeding into the chest. Stroke or blood clots. Damage to nearby areas of your body. Allergies to medicines or dyes. The need for a pacemaker if the normal system is damaged. Failure of the procedure to treat the problem. What happens before the procedure? Medicines Ask your doctor about: Changing or stopping your normal medicines. This is important. Taking aspirin and ibuprofen. Do not take these medicines unless your doctor tells you to take them. Taking other medicines, vitamins, herbs, and supplements. General instructions Follow instructions from your doctor about what you cannot eat or drink. Plan to have someone take you home from the hospital or clinic. If you will be going home right after the procedure, plan to have someone with you for 24 hours. Ask your doctor what steps will be taken to prevent infection. What happens during the procedure?  An IV tube will be put into one of your veins. You will be given a medicine to help you relax. The skin on your neck or groin will be numbed. A cut (incision) will be made in your neck or groin. A needle will be put through your cut and into a large vein. A tube (catheter) will be put into the needle. The tube will be moved to your heart. Dye may be put through the tube. This helps your doctor see your heart. Small devices (electrodes) on the tube will send out signals. A type of energy will be used to destroy some heart tissue. The tube will be taken out. Pressure will be held on your cut. This helps stop bleeding. A bandage will be put over your cut. The exact procedure may vary among doctors and hospitals. What happens after the procedure? You will be watched until you leave the hospital or clinic. This includes checking your heart rate, breathing rate, oxygen,  and blood pressure. Your cut will be watched for bleeding. You will need to lie still for a few hours. Do not drive for 24 hours or as long as your doctor tells you. Summary Cardiac ablation is a procedure to destroy some heart tissue. This is done to treat heart rhythm problems. Tell your doctor about any medical conditions you may have. Tell him or her about all medicines you are taking to treat them. This is a safe procedure. But problems may occur. These include infection, bruising, bleeding, and damage to nearby areas of your body. Follow what your doctor tells you about food and drink. You may also be told to change or stop some of your medicines. After the procedure, do not drive for 24 hours or as long as your doctor tells you. This information is not intended to replace advice given to you by your health care provider. Make sure you discuss any questions you have with your health care provider. Document Revised: 05/01/2021 Document Reviewed: 01/11/2019 Elsevier Patient Education  [employer].

## 2022-02-02 NOTE — Progress Notes (Signed)
Electrophysiology Office Note   Date:  02/02/2022   ID:  Kathy, [name] 1951-01-25, MRN [id number]  PCP:  Orpah Melter, MD  Cardiologist:   Primary Electrophysiologist:  Lamesha Tibbits Meredith Leeds, MD    Chief Complaint: AF   History of Present Illness: Kathy [name] is a 71 y.o. female who is being seen today for the evaluation of AF at the request of Fenton, Clint R, PA. Presenting today for electrophysiology evaluation.  She has a history of similar hypertension, tobacco abuse, atrial fibrillation.  She was diagnosed with atrial fibrillation 08/18/2018 after presenting with symptoms of palpitations.  Patient then presented for oral surgery.  She was found to have atrial fibrillation with rapid rates.  She had an echo that showed an ejection fraction 40 to 45% and a Zio monitor with a 100% atrial fibrillation burden.  She is post cardioversion 01/04/2022.  She does have more energy in sinus rhythm.  Today, she denies symptoms of palpitations, chest pain, shortness of breath, orthopnea, PND, lower extremity edema, claudication, dizziness, presyncope, syncope, bleeding, or neurologic sequela. The patient is tolerating medications without difficulties.  Her main symptom in atrial fibrillation is weakness and fatigue.  She is not short of breath.  She does find it difficult to do her daily activities due to her level of fatigue.   Past Medical History:  Diagnosis Date   Cystocele    Elevated cholesterol    Fibroid    Hypertension    Kidney stone    passed stone   Osteopenia    Perimenopausal vasomotor symptoms    Rectocele    SVD (spontaneous vaginal delivery)    x 2   Past Surgical History:  Procedure Laterality Date   ARM SURGERY  2005   AUGMENTATION MAMMAPLASTY     SALINE in 20's   BREAST SURGERY     augmentation in 20's   CARDIOVERSION N/A 01/04/2022   Procedure: CARDIOVERSION;  Surgeon: Sueanne Margarita, MD;  Location: [facility];  Service: Cardiovascular;   Laterality: N/A;   COLONOSCOPY     RECTOCELE REPAIR N/A 10/31/2013   Procedure: POSTERIOR REPAIR (RECTOCELE);  Surgeon: [name], MD;  Location: [facility];  Service: Gynecology;  Laterality: N/A;   right arm surgery     tendon repair   TONSILLECTOMY     TOTAL HIP ARTHROPLASTY  2001   RIGHT   TUBAL LIGATION     VAGINAL HYSTERECTOMY  1992   WISDOM TOOTH EXTRACTION       Current Outpatient Medications  Medication Sig Dispense Refill   amiodarone (PACERONE) 200 MG tablet Take 2 tablets TWICE daily for 2 weeks, then take 1 tablet TWICE daily for 2 weeks, then only take 1 tablet ONCE daily thereafter. 90 tablet 2   apixaban (ELIQUIS) 5 MG TABS tablet Take 1 tablet (5 mg total) by mouth 2 (two) times daily. Needs appt 200 tablet 1   Calcium Carbonate-Vitamin D (CALCIUM + D PO) Take 600 mg by mouth 2 (two) times daily.     chlorthalidone (HYGROTON) 25 MG tablet TAKE 1/2 TABLET BY MOUTH EVERY DAY IN THE MORNING     Cholecalciferol (VITAMIN D-3) 125 MCG (5000 UT) TABS Take 5,000 Units by mouth daily.     Coenzyme Q10 (COQ10) 200 MG CAPS Take 200 mg by mouth daily.     diazepam (VALIUM) 10 MG tablet TAKE 1 TABLET BY MOUTH EVERY 6 HOURS AS NEEDED FOR ANXIETY 30 tablet 2   metoprolol succinate (  TOPROL-XL) 25 MG 24 hr tablet TAKE 1 TABLET BY MOUTH DAILY 180 tablet 3   rosuvastatin (CRESTOR) 20 MG tablet Take 20 mg by mouth at bedtime.     sertraline (ZOLOFT) 100 MG tablet Take 100 mg by mouth daily.     No current facility-administered medications for this visit.    Allergies:   Codeine and Prednisone   Social History:  The patient  reports that she has been smoking cigarettes. She has been smoking an average of .1 packs per day. She has never used smokeless tobacco. She reports current alcohol use of about 3.0 standard drinks of alcohol per week. She reports that she does not use drugs.   Family History:  The patient's family history includes Heart disease in her father and mother;  Hypertension in her mother.    ROS:  Please see the history of present illness.   Otherwise, review of systems is positive for none.   All other systems are reviewed and negative.    PHYSICAL EXAM: VS:  BP 120/78   Pulse 70   Ht 5' (1.524 m)   Wt 164 lb (74.4 kg)   SpO2 96%   BMI 32.03 kg/m  , BMI Body mass index is 32.03 kg/m. GEN: Well nourished, well developed, in no acute distress  HEENT: normal  Neck: no JVD, carotid bruits, or masses Cardiac: RRR; no murmurs, rubs, or gallops,no edema  Respiratory:  clear to auscultation bilaterally, normal work of breathing GI: soft, nontender, nondistended, + BS MS: no deformity or atrophy  Skin: warm and dry Neuro:  Strength and sensation are intact Psych: euthymic mood, full affect  EKG:  EKG is ordered today. Personal review of the ekg ordered shows AF  Recent Labs: 12/28/2021: BUN 10; Creatinine, Ser 0.74; Hemoglobin 16.8; Platelets 233; Potassium 4.2; Sodium 139    Lipid Panel  No results found for: "CHOL", "TRIG", "HDL", "CHOLHDL", "VLDL", "LDLCALC", "LDLDIRECT"   Wt Readings from Last 3 Encounters:  02/02/22 164 lb (74.4 kg)  01/11/22 158 lb 3.2 oz (71.8 kg)  01/04/22 150 lb (68 kg)      Other studies Reviewed: Additional studies/ records that were reviewed today include: TTE 12/17/21  Review of the above records today demonstrates:   1. Left ventricular ejection fraction, by estimation, is 40 to 45%. Left  ventricular ejection fraction by 2D MOD biplane is 41.3 %. The left  ventricle has mildly decreased function. The left ventricle demonstrates  global hypokinesis. Left ventricular  diastolic function could not be evaluated.   2. Right ventricular systolic function is mildly reduced. The right  ventricular size is mildly enlarged. Tricuspid regurgitation signal is  inadequate for assessing PA pressure.   3. The mitral valve is grossly normal. Trivial mitral valve  regurgitation. No evidence of mitral stenosis.    4. The aortic valve was not well visualized. Aortic valve regurgitation  is not visualized. No aortic stenosis is present.   5. The inferior vena cava is dilated in size with >50% respiratory  variability, suggesting right atrial pressure of 8 mmHg.   Cardiac monitor 12/21/2021 personally reviewed 100% atrial fibrillation burden Less than 1% ventricular ectopy Average heart rate 108 bpm No triggered episodes recorded  Myoview 12/10/2021   The study is intermediate risk based upon ejection fraction of 39%.   No ST deviation was noted.   LV perfusion is abnormal. There is no evidence of ischemia. There is no evidence of infarction. Defect 1: There is a small defect  with mild reduction in uptake present in the apical location(s) that is fixed. There is normal wall motion in the defect area.   Left ventricular function is normal. Nuclear stress EF: 39 %. The left ventricular ejection fraction is moderately decreased (30-44%). End diastolic cavity size is normal. End systolic cavity size is normal.  ASSESSMENT AND PLAN:  1.  Persistent atrial fibrillation: CHA2DS2-VASc of 4.  Zio monitor with a 100% burden.  Due to low ejection fraction, maintenance of sinus rhythm would be the most beneficial option.  We discussed ablation versus medical management.  At this point she would prefer to avoid long-term medications.  Kathy [name] plan for ablation.  She has been feeling poorly in atrial fibrillation.  Kathy [name] start amiodarone.  When she has been loaded, plan for cardioversion. Toprol-XL 25 mg daily Eliquis 5 mg twice daily  Risk, benefits, and alternatives to EP study and radiofrequency ablation for afib were also discussed in detail today. These risks include but are not limited to stroke, bleeding, vascular damage, tamponade, perforation, damage to the esophagus, lungs, and other structures, pulmonary vein stenosis, worsening renal function, and death. The patient understands these risk and wishes to  proceed.  We Kathy [name] therefore proceed with catheter ablation at the next available time.  Carto, ICE, anesthesia are requested for the procedure.  Kathy [name] also obtain CT PV protocol prior to the procedure to exclude LAA thrombus and further evaluate atrial anatomy.   2.  Secondary hypercoagulable today: Currently on Eliquis for atrial fibrillation as above  3.  Hypertension: Currently well-controlled  4.  Chronic systolic heart failure: Potentially related to rapid atrial fibrillation.  Rate control with metoprolol.  Kathy [name] need repeat echo post ablation.   Current medicines are reviewed at length with the patient today.   The patient does not have concerns regarding her medicines.  The following changes were made today: Start amiodarone  Labs/ tests ordered today include:  No orders of the defined types were placed in this encounter.    Disposition:   FU with Freddie Dymek 3 months  Signed, Kathy [name] Meredith Leeds, MD  02/02/2022 12:05 PM     [address] [place] [address] [phone] (office) [phone] (fax)

## 2022-02-02 NOTE — H&P (View-Only) (Signed)
Electrophysiology Office Note   Date:  02/02/2022   ID:  [name], Kathy 08-19-1950, MRN [id number]  PCP:  Orpah Melter, MD  Cardiologist:   Primary Electrophysiologist:  Sherrica Niehaus Meredith Leeds, MD    Chief Complaint: AF   History of Present Illness: Kathy [name] is a 71 y.o. female who is being seen today for the evaluation of AF at the request of Fenton, Clint R, PA. Presenting today for electrophysiology evaluation.  She has a history of similar hypertension, tobacco abuse, atrial fibrillation.  She was diagnosed with atrial fibrillation 08/18/2018 after presenting with symptoms of palpitations.  Patient then presented for oral surgery.  She was found to have atrial fibrillation with rapid rates.  She had an echo that showed an ejection fraction 40 to 45% and a Zio monitor with a 100% atrial fibrillation burden.  She is post cardioversion 01/04/2022.  She does have more energy in sinus rhythm.  Today, she denies symptoms of palpitations, chest pain, shortness of breath, orthopnea, PND, lower extremity edema, claudication, dizziness, presyncope, syncope, bleeding, or neurologic sequela. The patient is tolerating medications without difficulties.  Her main symptom in atrial fibrillation is weakness and fatigue.  She is not short of breath.  She does find it difficult to do her daily activities due to her level of fatigue.   Past Medical History:  Diagnosis Date   Cystocele    Elevated cholesterol    Fibroid    Hypertension    Kidney stone    passed stone   Osteopenia    Perimenopausal vasomotor symptoms    Rectocele    SVD (spontaneous vaginal delivery)    x 2   Past Surgical History:  Procedure Laterality Date   ARM SURGERY  2005   AUGMENTATION MAMMAPLASTY     SALINE in 20's   BREAST SURGERY     augmentation in 20's   CARDIOVERSION N/A 01/04/2022   Procedure: CARDIOVERSION;  Surgeon: Sueanne Margarita, MD;  Location: [facility];  Service: Cardiovascular;   Laterality: N/A;   COLONOSCOPY     RECTOCELE REPAIR N/A 10/31/2013   Procedure: POSTERIOR REPAIR (RECTOCELE);  Surgeon: [name], MD;  Location: [facility];  Service: Gynecology;  Laterality: N/A;   right arm surgery     tendon repair   TONSILLECTOMY     TOTAL HIP ARTHROPLASTY  2001   RIGHT   TUBAL LIGATION     VAGINAL HYSTERECTOMY  1992   WISDOM TOOTH EXTRACTION       Current Outpatient Medications  Medication Sig Dispense Refill   amiodarone (PACERONE) 200 MG tablet Take 2 tablets TWICE daily for 2 weeks, then take 1 tablet TWICE daily for 2 weeks, then only take 1 tablet ONCE daily thereafter. 90 tablet 2   apixaban (ELIQUIS) 5 MG TABS tablet Take 1 tablet (5 mg total) by mouth 2 (two) times daily. Needs appt 200 tablet 1   Calcium Carbonate-Vitamin D (CALCIUM + D PO) Take 600 mg by mouth 2 (two) times daily.     chlorthalidone (HYGROTON) 25 MG tablet TAKE 1/2 TABLET BY MOUTH EVERY DAY IN THE MORNING     Cholecalciferol (VITAMIN D-3) 125 MCG (5000 UT) TABS Take 5,000 Units by mouth daily.     Coenzyme Q10 (COQ10) 200 MG CAPS Take 200 mg by mouth daily.     diazepam (VALIUM) 10 MG tablet TAKE 1 TABLET BY MOUTH EVERY 6 HOURS AS NEEDED FOR ANXIETY 30 tablet 2   metoprolol succinate (  TOPROL-XL) 25 MG 24 hr tablet TAKE 1 TABLET BY MOUTH DAILY 180 tablet 3   rosuvastatin (CRESTOR) 20 MG tablet Take 20 mg by mouth at bedtime.     sertraline (ZOLOFT) 100 MG tablet Take 100 mg by mouth daily.     No current facility-administered medications for this visit.    Allergies:   Codeine and Prednisone   Social History:  The patient  reports that she has been smoking cigarettes. She has been smoking an average of .1 packs per day. She has never used smokeless tobacco. She reports current alcohol use of about 3.0 standard drinks of alcohol per week. She reports that she does not use drugs.   Family History:  The patient's family history includes Heart disease in her father and mother;  Hypertension in her mother.    ROS:  Please see the history of present illness.   Otherwise, review of systems is positive for none.   All other systems are reviewed and negative.    PHYSICAL EXAM: VS:  BP 120/78   Pulse 70   Ht 5' (1.524 m)   Wt 164 lb (74.4 kg)   SpO2 96%   BMI 32.03 kg/m  , BMI Body mass index is 32.03 kg/m. GEN: Well nourished, well developed, in no acute distress  HEENT: normal  Neck: no JVD, carotid bruits, or masses Cardiac: RRR; no murmurs, rubs, or gallops,no edema  Respiratory:  clear to auscultation bilaterally, normal work of breathing GI: soft, nontender, nondistended, + BS MS: no deformity or atrophy  Skin: warm and dry Neuro:  Strength and sensation are intact Psych: euthymic mood, full affect  EKG:  EKG is ordered today. Personal review of the ekg ordered shows AF  Recent Labs: 12/28/2021: BUN 10; Creatinine, Ser 0.74; Hemoglobin 16.8; Platelets 233; Potassium 4.2; Sodium 139    Lipid Panel  No results found for: "CHOL", "TRIG", "HDL", "CHOLHDL", "VLDL", "LDLCALC", "LDLDIRECT"   Wt Readings from Last 3 Encounters:  02/02/22 164 lb (74.4 kg)  01/11/22 158 lb 3.2 oz (71.8 kg)  01/04/22 150 lb (68 kg)      Other studies Reviewed: Additional studies/ records that were reviewed today include: TTE 12/17/21  Review of the above records today demonstrates:   1. Left ventricular ejection fraction, by estimation, is 40 to 45%. Left  ventricular ejection fraction by 2D MOD biplane is 41.3 %. The left  ventricle has mildly decreased function. The left ventricle demonstrates  global hypokinesis. Left ventricular  diastolic function could not be evaluated.   2. Right ventricular systolic function is mildly reduced. The right  ventricular size is mildly enlarged. Tricuspid regurgitation signal is  inadequate for assessing PA pressure.   3. The mitral valve is grossly normal. Trivial mitral valve  regurgitation. No evidence of mitral stenosis.    4. The aortic valve was not well visualized. Aortic valve regurgitation  is not visualized. No aortic stenosis is present.   5. The inferior vena cava is dilated in size with >50% respiratory  variability, suggesting right atrial pressure of 8 mmHg.   Cardiac monitor 12/21/2021 personally reviewed 100% atrial fibrillation burden Less than 1% ventricular ectopy Average heart rate 108 bpm No triggered episodes recorded  Myoview 12/10/2021   The study is intermediate risk based upon ejection fraction of 39%.   No ST deviation was noted.   LV perfusion is abnormal. There is no evidence of ischemia. There is no evidence of infarction. Defect 1: There is a small defect  with mild reduction in uptake present in the apical location(s) that is fixed. There is normal wall motion in the defect area.   Left ventricular function is normal. Nuclear stress EF: 39 %. The left ventricular ejection fraction is moderately decreased (30-44%). End diastolic cavity size is normal. End systolic cavity size is normal.  ASSESSMENT AND PLAN:  1.  Persistent atrial fibrillation: CHA2DS2-VASc of 4.  Zio monitor with a 100% burden.  Due to low ejection fraction, maintenance of sinus rhythm would be the most beneficial option.  We discussed ablation versus medical management.  At this point she would prefer to avoid long-term medications.  Destine Ambroise plan for ablation.  She has been feeling poorly in atrial fibrillation.  Ismahan Lippman start amiodarone.  When she has been loaded, plan for cardioversion. Toprol-XL 25 mg daily Eliquis 5 mg twice daily  Risk, benefits, and alternatives to EP study and radiofrequency ablation for afib were also discussed in detail today. These risks include but are not limited to stroke, bleeding, vascular damage, tamponade, perforation, damage to the esophagus, lungs, and other structures, pulmonary vein stenosis, worsening renal function, and death. The patient understands these risk and wishes to  proceed.  We Tabor Bartram therefore proceed with catheter ablation at the next available time.  Carto, ICE, anesthesia are requested for the procedure.  Zitlali Primm also obtain CT PV protocol prior to the procedure to exclude LAA thrombus and further evaluate atrial anatomy.   2.  Secondary hypercoagulable today: Currently on Eliquis for atrial fibrillation as above  3.  Hypertension: Currently well-controlled  4.  Chronic systolic heart failure: Potentially related to rapid atrial fibrillation.  Rate control with metoprolol.  Helio Lack need repeat echo post ablation.   Current medicines are reviewed at length with the patient today.   The patient does not have concerns regarding her medicines.  The following changes were made today: Start amiodarone  Labs/ tests ordered today include:  No orders of the defined types were placed in this encounter.    Disposition:   FU with Kamyah Wilhelmsen 3 months  Signed, Tashiana Lamarca Meredith Leeds, MD  02/02/2022 12:05 PM     [address] [place] [address] [phone] (office) [phone] (fax)

## 2022-02-03 NOTE — Telephone Encounter (Signed)
Seen by Dr. Curt Bears yesterday. Plan for ablation. She was started on Amiodarone and plan for cardioversion 02/24/22. Will require 3 weeks of uninterrupted anticoagulation prior to procedure and at minimum 4 weeks post procedure. Low suspicion we will be able to provide clearance at this time, but will route to pharmD for their input.   Loel Dubonnet, NP

## 2022-02-03 NOTE — Telephone Encounter (Signed)
Per EP visit 12/12, pt is now scheduled for cardioversion on 02/24/22 with plans for ablation as well. She will not be able to stop her anticoagulation for the forseeable future. Would see if dental office is ok completing 1 dental implant with pt remaining on her Eliquis. Otherwise, procedure will need to be moved back a few months (don't see ablation date scheduled yet but needs uninterrupted anticoag for 3 months after, and pt currently cannot stop anticoag until Feb at the earliest based off of cardioversion).

## 2022-02-04 NOTE — Telephone Encounter (Signed)
Please note by patient and dental clinic that she will not be able to interrupt Eliquis for the foreseeable future due to plan for atrial fibrillation ablation.    Please contact our office if further questions.  Thank you, Emmaline Life, NP-C  02/04/2022, 8:42 AM [address], [address] Office [phone] Fax [phone]

## 2022-02-04 NOTE — Telephone Encounter (Signed)
Spoke with patient and requesting office and made them aware that patient cannot hold Eliquis due to upcoming ablation.

## 2022-02-24 ENCOUNTER — Encounter (HOSPITAL_COMMUNITY)
Admission: RE | Disposition: A | Discharge status: Home / Self Care | Payer: Self-pay | Source: Home / Self Care | Attending: Internal Medicine

## 2022-02-24 ENCOUNTER — Ambulatory Visit (HOSPITAL_COMMUNITY)
Admission: RE | Admit: 2022-02-24 | Discharge: 2022-02-24 | Disposition: A | Discharge status: Home / Self Care | Payer: Medicare Other | Attending: Internal Medicine | Admitting: Internal Medicine

## 2022-02-24 DIAGNOSIS — Z538 Procedure and treatment not carried out for other reasons: Secondary | ICD-10-CM | POA: Diagnosis not present

## 2022-02-24 DIAGNOSIS — I480 Paroxysmal atrial fibrillation: Secondary | ICD-10-CM

## 2022-02-24 DIAGNOSIS — I4819 Other persistent atrial fibrillation: Secondary | ICD-10-CM

## 2022-02-24 SURGERY — CANCELLED PROCEDURE
Anesthesia: General

## 2022-02-24 NOTE — Progress Notes (Signed)
EKG shoes NSR. No need for CV procedure per Dr. Harl Bowie.

## 2022-02-25 NOTE — Interval H&P Note (Signed)
History and Physical Interval Note:  02/25/2022 8:13 AM  Kathy [name]  has presented today for surgery, with the diagnosis of ATRIAL FIBRILLATION.  The various methods of treatment have been discussed with the patient and family. After consideration of risks, benefits and other options for treatment, the patient has consented to  Procedure(s): CANCELLED PROCEDURE (N/A) as a surgical intervention.  The patient's history has been reviewed, patient examined, no change in status, stable for surgery.  I have reviewed the patient's chart and labs.  Questions were answered to the patient's satisfaction.     [name]

## 2022-03-11 DIAGNOSIS — I480 Paroxysmal atrial fibrillation: Secondary | ICD-10-CM | POA: Diagnosis not present

## 2022-03-11 DIAGNOSIS — R945 Abnormal results of liver function studies: Secondary | ICD-10-CM | POA: Diagnosis not present

## 2022-03-11 DIAGNOSIS — E782 Mixed hyperlipidemia: Secondary | ICD-10-CM | POA: Diagnosis not present

## 2022-03-11 DIAGNOSIS — E210 Primary hyperparathyroidism: Secondary | ICD-10-CM | POA: Diagnosis not present

## 2022-03-11 DIAGNOSIS — D6869 Other thrombophilia: Secondary | ICD-10-CM | POA: Diagnosis not present

## 2022-03-11 DIAGNOSIS — Z7901 Long term (current) use of anticoagulants: Secondary | ICD-10-CM | POA: Diagnosis not present

## 2022-03-11 DIAGNOSIS — I10 Essential (primary) hypertension: Secondary | ICD-10-CM | POA: Diagnosis not present

## 2022-03-11 DIAGNOSIS — M8580 Other specified disorders of bone density and structure, unspecified site: Secondary | ICD-10-CM | POA: Diagnosis not present

## 2022-03-17 ENCOUNTER — Telehealth: Payer: Self-pay | Admitting: *Deleted

## 2022-03-17 NOTE — Telephone Encounter (Signed)
   Pre-operative Risk Assessment    Patient Name: Kathy [name]  DOB: 14-Apr-1950 MRN: [id number]    THIS IS A DUPLICATE REQUEST, SEE THE ORIGINAL REQUEST 01/27/22; PT WAS UNABLE TO HAVE PROCEDURE BACK IN DECEMBER DUE TO UNABLE TO HOLD [place] AFTER 02/04/22. DR. Jetta Lout IS CHECKING TO SEE IF THE PT IS ABLE TO HAVE HER DENTAL PROCEDURE AT THIS TIME.  Request for Surgical Clearance    Procedure:   ONE DENTAL IMPLANT PLACEMENT (SITE # [id number])  Date of Surgery:  Clearance TBD                                 Surgeon:  DR. Jetta Lout, DDS Surgeon's Group or Practice Name:  [facility] Phone number:  [phone] Fax number:  [phone]   Type of Clearance Requested:   - Medical  - Pharmacy:  Hold Apixaban (Eliquis)     Type of Anesthesia:   IV SEDATION USING LOCAL ANESTHESIA CONTAINING EPI   Additional requests/questions:    [name]   03/17/2022, 4:14 PM

## 2022-03-19 NOTE — Telephone Encounter (Signed)
   Name: Kathy [name]  DOB: 10-May-1950  MRN: [id number]  Primary Cardiologist: None  Chart reviewed as part of pre-operative protocol coverage. Because of [name]'s past medical history and time since last visit, she will require a follow-up telephone visit in order to better assess preoperative cardiovascular risk.  Pre-op covering staff: - Please schedule appointment and call patient to inform them. If patient already had an upcoming appointment within acceptable timeframe, please add "pre-op clearance" to the appointment notes so provider is aware. - Please contact requesting surgeon's office via preferred method (i.e, phone, fax) to inform them of need for appointment prior to surgery.  Holding parameters addressed by PharmD below.    Elgie Collard, PA-C  03/19/2022, 4:34 PM

## 2022-03-19 NOTE — Telephone Encounter (Signed)
Left message to call back for tele pre op appt 

## 2022-03-19 NOTE — Telephone Encounter (Addendum)
Patient with diagnosis of afib on Eliquis for anticoagulation.    Procedure: 1 dental implant Date of procedure: TBD  CHA2DS2-VASc Score = 4  This indicates a 4.8% annual risk of stroke. The patient's score is based upon: CHF History: 1 HTN History: 1 Diabetes History: 0 Stroke History: 0 Vascular Disease History: 0 Age Score: 1 Gender Score: 1  CrCl 40m/min Platelet count 233K  Patient does not require pre-op antibiotics for dental procedure.  Pt presented for DCCV on 02/24/22 but was in NSR so procedure was canceled. Still seems to be pending ablation but don't see date scheduled in Epic (may be in May based on notes for upcoming April EP office visit). Ok to hold Eliquis for 1 day prior to dental implant. She will then need to be back on anticoag without interruption for at least 3 weeks prior to future ablation.  **This guidance is not considered finalized until pre-operative APP has relayed final recommendations.**

## 2022-03-22 ENCOUNTER — Telehealth: Payer: Self-pay | Admitting: *Deleted

## 2022-03-22 NOTE — Telephone Encounter (Signed)
Pt agreeable to tele pre op appt 03/24/22 @ 9 am. Med rec and consent are done.     Patient Consent for Virtual Visit        Kathy [name] has provided verbal consent on 03/22/2022 for a virtual visit (video or telephone).   CONSENT FOR VIRTUAL VISIT FOR:  Kathy [name]  By participating in this virtual visit I agree to the following:  I hereby voluntarily request, consent and authorize [facility] and its employed or contracted physicians, physician assistants, nurse practitioners or other licensed health care professionals (the Practitioner), to provide me with telemedicine health care services (the "Services") as deemed necessary by the treating Practitioner. I acknowledge and consent to receive the Services by the Practitioner via telemedicine. I understand that the telemedicine visit will involve communicating with the Practitioner through live audiovisual communication technology and the disclosure of certain medical information by electronic transmission. I acknowledge that I have been given the opportunity to request an in-person assessment or other available alternative prior to the telemedicine visit and am voluntarily participating in the telemedicine visit.  I understand that I have the right to withhold or withdraw my consent to the use of telemedicine in the course of my care at any time, without affecting my right to future care or treatment, and that the Practitioner or I may terminate the telemedicine visit at any time. I understand that I have the right to inspect all information obtained and/or recorded in the course of the telemedicine visit and may receive copies of available information for a reasonable fee.  I understand that some of the potential risks of receiving the Services via telemedicine include:  Delay or interruption in medical evaluation due to technological equipment failure or disruption; Information transmitted may not be sufficient (e.g. poor  resolution of images) to allow for appropriate medical decision making by the Practitioner; and/or  In rare instances, security protocols could fail, causing a breach of personal health information.  Furthermore, I acknowledge that it is my responsibility to provide information about my medical history, conditions and care that is complete and accurate to the best of my ability. I acknowledge that Practitioner's advice, recommendations, and/or decision may be based on factors not within their control, such as incomplete or inaccurate data provided by me or distortions of diagnostic images or specimens that may result from electronic transmissions. I understand that the practice of medicine is not an exact science and that Practitioner makes no warranties or guarantees regarding treatment outcomes. I acknowledge that a copy of this consent can be made available to me via my patient portal ([facility]), or I can request a printed copy by calling the office of [facility].    I understand that my insurance will be billed for this visit.   I have read or had this consent read to me. I understand the contents of this consent, which adequately explains the benefits and risks of the Services being provided via telemedicine.  I have been provided ample opportunity to ask questions regarding this consent and the Services and have had my questions answered to my satisfaction. I give my informed consent for the services to be provided through the use of telemedicine in my medical care

## 2022-03-22 NOTE — Telephone Encounter (Signed)
Patient returned Pre-op call. 

## 2022-03-22 NOTE — Telephone Encounter (Signed)
Pt agreeable to tele pre op appt 03/24/22 @ 9 am. Med rec and consent are done.

## 2022-03-23 NOTE — Progress Notes (Unsigned)
Virtual Visit via Telephone Note   Because of Kathy [name]'s co-morbid illnesses, she is at least at moderate risk for complications without adequate follow up.  This format is felt to be most appropriate for this patient at this time.  The patient did not have access to video technology/had technical difficulties with video requiring transitioning to audio format only (telephone).  All issues noted in this document were discussed and addressed.  No physical exam could be performed with this format.  Please refer to the patient's chart for her consent to telehealth for [facility].  Evaluation Performed:  Preoperative cardiovascular risk assessment _____________   Date:  03/23/2022   Patient ID:  Kathy [name], DOB [birth year], MRN [id number] Patient Location:  Home Provider location:   Office  Primary Care Provider:  Orpah Melter, MD Primary Cardiologist:  None  Chief Complaint / Patient Profile   72 y.o. y/o female with a h/o persistent atrial fibrillation on chronic anticoagulation, HFrEF with EF 40-45% on echo 12/17/21, hypertension, tobacco abuse who is pending dental implant and presents today for telephonic preoperative cardiovascular risk assessment.  History of Present Illness    Kathy [name] is a 72 y.o. female who presents via audio/video conferencing for a telehealth visit today.  Pt was last seen in cardiology clinic on 02/02/22 by Dr. Curt Bears.  At that time NALLELI LARGENT was doing well.  The patient is now pending procedure as outlined above. Since her last visit, she denies chest pain, shortness of breath, lower extremity edema, fatigue, palpitations, melena, hematuria, hemoptysis, diaphoresis, weakness, presyncope, syncope, orthopnea, and PND. She is able to achieve > 4 METS activity on a consistent basis without concerning cardiac symptoms.    Past Medical History    Past Medical History:  Diagnosis Date   Cystocele    Elevated cholesterol     Fibroid    Hypertension    Kidney stone    passed stone   Osteopenia    Perimenopausal vasomotor symptoms    Rectocele    SVD (spontaneous vaginal delivery)    x 2   Past Surgical History:  Procedure Laterality Date   ARM SURGERY  2005   AUGMENTATION MAMMAPLASTY     SALINE in 20's   BREAST SURGERY     augmentation in 20's   CARDIOVERSION N/A 01/04/2022   Procedure: CARDIOVERSION;  Surgeon: Sueanne Margarita, MD;  Location: [facility];  Service: Cardiovascular;  Laterality: N/A;   COLONOSCOPY     RECTOCELE REPAIR N/A 10/31/2013   Procedure: POSTERIOR REPAIR (RECTOCELE);  Surgeon: [name], MD;  Location: [facility];  Service: Gynecology;  Laterality: N/A;   right arm surgery     tendon repair   TONSILLECTOMY     TOTAL HIP ARTHROPLASTY  2001   RIGHT   TUBAL LIGATION     VAGINAL HYSTERECTOMY  1992   WISDOM TOOTH EXTRACTION      Allergies  Allergies  Allergen Reactions   Codeine Nausea And Vomiting   Prednisone Palpitations    Home Medications    Prior to Admission medications   Medication Sig Start Date End Date Taking? Authorizing Provider  amiodarone (PACERONE) 200 MG tablet Take 2 tablets TWICE daily for 2 weeks, then take 1 tablet TWICE daily for 2 weeks, then only take 1 tablet ONCE daily thereafter. Patient taking differently: Take 200 mg by mouth 2 (two) times daily. Take 2 tablets TWICE daily for 2 weeks, then take 1 tablet TWICE  daily for 2 weeks, then only take 1 tablet ONCE daily thereafter. 02/02/22   Camnitz, Ocie Doyne, MD  apixaban (ELIQUIS) 5 MG TABS tablet Take 1 tablet (5 mg total) by mouth 2 (two) times daily. Needs appt 11/26/21   Sherran Needs, NP  Calcium Carbonate-Vitamin D (CALCIUM + D PO) Take 600 mg by mouth 2 (two) times daily.    [provider]  chlorthalidone (HYGROTON) 25 MG tablet TAKE 1/2 TABLET BY MOUTH EVERY DAY IN THE MORNING 03/21/20   [provider]  Cholecalciferol (VITAMIN D-3) 125 MCG (5000 UT) TABS Take  5,000 Units by mouth daily.    [provider]  Coenzyme Q10 (COQ10) 200 MG CAPS Take 200 mg by mouth 2 (two) times daily.    [provider]  diazepam (VALIUM) 10 MG tablet TAKE 1 TABLET BY MOUTH EVERY 6 HOURS AS NEEDED FOR ANXIETY 09/14/18   Princess Bruins, MD  metoprolol succinate (TOPROL-XL) 25 MG 24 hr tablet TAKE 1 TABLET BY MOUTH DAILY 09/21/21   Fenton, Clint R, PA  rosuvastatin (CRESTOR) 20 MG tablet Take 20 mg by mouth at bedtime. 09/30/21   [provider]  sertraline (ZOLOFT) 100 MG tablet Take 100 mg by mouth daily. 02/26/19   [provider]    Physical Exam    Vital Signs:  NICHELE SLAWSON does not have vital signs available for review today.  Given telephonic nature of communication, physical exam is limited. AAOx3. NAD. Normal affect.  Speech and respirations are unlabored.  Accessory Clinical Findings    None  Assessment & Plan    1.  Preoperative Cardiovascular Risk Assessment: The patient is doing well from a cardiac perspective. Therefore, based on ACC/AHA guidelines, the patient would be at acceptable risk for the planned procedure without further cardiovascular testing. According to the Revised Cardiac Risk Index (RCRI), her Perioperative Risk of Major Cardiac Event is (%): 0.4 Her Functional Capacity in METs is: 7.59 according to the Duke Activity Status Index (DASI).  The patient was advised that if she develops new symptoms prior to surgery to contact our office to arrange for a follow-up visit, and she verbalized understanding.  Patient does not require pre-op antibiotics for a cardiac indication for dental procedure.  Pt presented for DCCV on 02/24/22 but was in NSR so procedure was canceled. Still seems to be pending ablation but don't see date scheduled in Epic (may be in May based on notes for upcoming April EP office visit). Ok to hold Eliquis for 1 day prior to dental implant. She will then need to be back on anticoag  without interruption for at least 3 weeks prior to future ablation.  A copy of this note will be routed to requesting surgeon.  Time:   Today, I have spent 10 minutes with the patient with telehealth technology discussing medical history, symptoms, and management plan.    Emmaline Life, NP-C  03/24/2022, 8:56 AM [address], [address] Office [phone] Fax [phone]

## 2022-03-24 ENCOUNTER — Encounter: Payer: Self-pay | Admitting: Nurse Practitioner

## 2022-03-24 ENCOUNTER — Ambulatory Visit: Payer: Medicare Other | Attending: Cardiology | Admitting: Nurse Practitioner

## 2022-03-24 DIAGNOSIS — Z01810 Encounter for preprocedural cardiovascular examination: Secondary | ICD-10-CM | POA: Diagnosis not present

## 2022-03-26 ENCOUNTER — Other Ambulatory Visit (HOSPITAL_COMMUNITY): Payer: Self-pay

## 2022-03-26 MED ORDER — APIXABAN 5 MG PO TABS: 5.0000 mg | ORAL_TABLET | Freq: Two times a day (BID) | ORAL | 0 refills | Status: DC

## 2022-04-01 ENCOUNTER — Encounter (HOSPITAL_COMMUNITY): Payer: Self-pay | Admitting: *Deleted

## 2022-05-12 ENCOUNTER — Other Ambulatory Visit: Payer: Self-pay | Admitting: Cardiology

## 2022-05-12 ENCOUNTER — Encounter: Payer: Self-pay | Admitting: *Deleted

## 2022-05-12 ENCOUNTER — Other Ambulatory Visit: Payer: Self-pay | Admitting: *Deleted

## 2022-05-12 DIAGNOSIS — I4819 Other persistent atrial fibrillation: Secondary | ICD-10-CM

## 2022-05-30 NOTE — Progress Notes (Unsigned)
Cardiology Office Note Date:  06/01/2022  Patient ID:  Kathy [name] Mar 31, 1950, MRN [id number] PCP:  Joycelyn Rua, MD  Electrophysiologist: Dr. Elberta Fortis    History of Present Illness: Kathy [name] is a 72 y.o. female with history of HTN, smoker, AFib, chronic CHF (systolic), NICM  She saw Dr. Elberta Fortis last 02/02/22, symptomatic AFib w/fatigue/weakness, started on amiodarone and DCCV, with plans for ablation  TODAY She is accompanied by her husband. She is doing and feeling well Reports excellent medication compliance. Walks for exercise No CP, palpitations, SOB No bleeding or signs of bleeding  AFib/AAD Hx Diagnosed June 2020 Amiodarone started Dec 2023   Past Medical History:  Diagnosis Date   Cystocele    Elevated cholesterol    Fibroid    Hypertension    Kidney stone    passed stone   Osteopenia    Perimenopausal vasomotor symptoms    Rectocele    SVD (spontaneous vaginal delivery)    x 2    Past Surgical History:  Procedure Laterality Date   ARM SURGERY  2005   AUGMENTATION MAMMAPLASTY     SALINE in 20's   BREAST SURGERY     augmentation in 20's   CARDIOVERSION N/A 01/04/2022   Procedure: CARDIOVERSION;  Surgeon: Quintella Reichert, MD;  Location: [facility];  Service: Cardiovascular;  Laterality: N/A;   COLONOSCOPY     RECTOCELE REPAIR N/A 10/31/2013   Procedure: POSTERIOR REPAIR (RECTOCELE);  Surgeon: Ok Edwards, MD;  Location: [facility];  Service: Gynecology;  Laterality: N/A;   right arm surgery     tendon repair   TONSILLECTOMY     TOTAL HIP ARTHROPLASTY  2001   RIGHT   TUBAL LIGATION     VAGINAL HYSTERECTOMY  1992   WISDOM TOOTH EXTRACTION      Current Outpatient Medications  Medication Sig Dispense Refill   apixaban (ELIQUIS) 5 MG TABS tablet Take 1 tablet (5 mg total) by mouth 2 (two) times daily. Needs appt 28 tablet 0   Calcium Carbonate-Vitamin D (CALCIUM + D PO) Take 600 mg by mouth 2 (two) times daily.      chlorthalidone (HYGROTON) 25 MG tablet TAKE 1/2 TABLET BY MOUTH EVERY DAY IN THE MORNING     Cholecalciferol (VITAMIN D-3) 125 MCG (5000 UT) TABS Take 5,000 Units by mouth daily.     Coenzyme Q10 (COQ10) 200 MG CAPS Take 200 mg by mouth 2 (two) times daily.     diazepam (VALIUM) 10 MG tablet TAKE 1 TABLET BY MOUTH EVERY 6 HOURS AS NEEDED FOR ANXIETY 30 tablet 2   metoprolol succinate (TOPROL-XL) 25 MG 24 hr tablet TAKE 1 TABLET BY MOUTH DAILY 180 tablet 3   rosuvastatin (CRESTOR) 20 MG tablet Take 20 mg by mouth at bedtime.     sertraline (ZOLOFT) 100 MG tablet Take 100 mg by mouth daily.     No current facility-administered medications for this visit.    Allergies:   Codeine and Prednisone   Social History:  The patient  reports that she has been smoking cigarettes. She has been smoking an average of .1 packs per day. She has never used smokeless tobacco. She reports current alcohol use of about 3.0 standard drinks of alcohol per week. She reports that she does not use drugs.   Family History:  The patient's family history includes Heart disease in her father and mother; Hypertension in her mother.  ROS:  Please see the history of  present illness.    All other systems are reviewed and otherwise negative.   PHYSICAL EXAM:  VS:  BP 120/80   Pulse (!) 59   Ht 5\' 1"  (1.549 m)   Wt 169 lb 6.4 oz (76.8 kg)   SpO2 95%   BMI 32.01 kg/m  BMI: Body mass index is 32.01 kg/m. Well nourished, well developed, in no acute distress HEENT: normocephalic, atraumatic Neck: no JVD, carotid bruits or masses Cardiac:  RRR; no significant murmurs, no rubs, or gallops Lungs:  CTA b/l, no wheezing, rhonchi or rales Abd: soft, nontender MS: no deformity or atrophy Ext: no edema Skin: warm and dry, no rash Neuro:  No gross deficits appreciated Psych: euthymic mood, full affect   EKG:  Done today and reviewed by myself shows  SB 59bpm, LAD   TTE 12/17/21   1. Left ventricular ejection fraction,  by estimation, is 40 to 45%. Left  ventricular ejection fraction by 2D MOD biplane is 41.3 %. The left  ventricle has mildly decreased function. The left ventricle demonstrates  global hypokinesis. Left ventricular  diastolic function could not be evaluated.   2. Right ventricular systolic function is mildly reduced. The right  ventricular size is mildly enlarged. Tricuspid regurgitation signal is  inadequate for assessing PA pressure.   3. The mitral valve is grossly normal. Trivial mitral valve  regurgitation. No evidence of mitral stenosis.   4. The aortic valve was not well visualized. Aortic valve regurgitation  is not visualized. No aortic stenosis is present.   5. The inferior vena cava is dilated in size with >50% respiratory  variability, suggesting right atrial pressure of 8 mmHg.    Cardiac monitor 12/21/2021  100% atrial fibrillation burden Less than 1% ventricular ectopy Average heart rate 108 bpm No triggered episodes recorded   Myoview 12/10/2021   The study is intermediate risk based upon ejection fraction of 39%.   No ST deviation was noted.   LV perfusion is abnormal. There is no evidence of ischemia. There is no evidence of infarction. Defect 1: There is a small defect with mild reduction in uptake present in the apical location(s) that is fixed. There is normal wall motion in the defect area.   Left ventricular function is normal. Nuclear stress EF: 39 %. The left ventricular ejection fraction is moderately decreased (30-44%). End diastolic cavity size is normal. End systolic cavity size is normal.    Recent Labs: 12/28/2021: BUN 10; Creatinine, Ser 0.74; Hemoglobin 16.8; Platelets 233; Potassium 4.2; Sodium 139  No results found for requested labs within last 365 days.   CrCl cannot be calculated (Patient's most recent lab result is older than the maximum 21 days allowed.).   Wt Readings from Last 3 Encounters:  06/01/22 169 lb 6.4 oz (76.8 kg)  02/02/22 164 lb  (74.4 kg)  01/11/22 158 lb 3.2 oz (71.8 kg)     Other studies reviewed: Additional studies/records reviewed today include: summarized above  ASSESSMENT AND PLAN:  Persistent AFib CHA2DS2Vasc is 3, on Eliquis, appropriately dosed  Reviewed procedure day, expectations and post procedure Discussed importance of not missing any doses of her eliquis Anticipation at 62mo or so post procedure hopefully she can get off amiodarone  NICM Chronic CHF No symptoms or exam findings of volume OL  Felt to be 2/2 AF, RVR Plan repeat imaging post ablation  HTN Looks good   Disposition: F/u with usual post procedure appointments, sooner if needed  Current medicines are reviewed at  length with the patient today.  The patient did not have any concerns regarding medicines.  Norma FredricksonSigned, Loreley Schwall, PA-C 06/01/2022 9:50 AM     [facility] [address] [place] [address] [phone] (office)  [phone] (fax)

## 2022-05-30 NOTE — H&P (View-Only) (Signed)
Cardiology Office Note Date:  06/01/2022  Patient ID:  Kathy, [name] Mar 31, 1950, MRN [id number] PCP:  Joycelyn Rua, MD  Electrophysiologist: Dr. Elberta Fortis    History of Present Illness: Kathy [name] is a 72 y.o. female with history of HTN, smoker, AFib, chronic CHF (systolic), NICM  She saw Dr. Elberta Fortis last 02/02/22, symptomatic AFib w/fatigue/weakness, started on amiodarone and DCCV, with plans for ablation  TODAY She is accompanied by her husband. She is doing and feeling well Reports excellent medication compliance. Walks for exercise No CP, palpitations, SOB No bleeding or signs of bleeding  AFib/AAD Hx Diagnosed June 2020 Amiodarone started Dec 2023   Past Medical History:  Diagnosis Date   Cystocele    Elevated cholesterol    Fibroid    Hypertension    Kidney stone    passed stone   Osteopenia    Perimenopausal vasomotor symptoms    Rectocele    SVD (spontaneous vaginal delivery)    x 2    Past Surgical History:  Procedure Laterality Date   ARM SURGERY  2005   AUGMENTATION MAMMAPLASTY     SALINE in 20's   BREAST SURGERY     augmentation in 20's   CARDIOVERSION N/A 01/04/2022   Procedure: CARDIOVERSION;  Surgeon: Quintella Reichert, MD;  Location: [facility];  Service: Cardiovascular;  Laterality: N/A;   COLONOSCOPY     RECTOCELE REPAIR N/A 10/31/2013   Procedure: POSTERIOR REPAIR (RECTOCELE);  Surgeon: Ok Edwards, MD;  Location: [facility];  Service: Gynecology;  Laterality: N/A;   right arm surgery     tendon repair   TONSILLECTOMY     TOTAL HIP ARTHROPLASTY  2001   RIGHT   TUBAL LIGATION     VAGINAL HYSTERECTOMY  1992   WISDOM TOOTH EXTRACTION      Current Outpatient Medications  Medication Sig Dispense Refill   apixaban (ELIQUIS) 5 MG TABS tablet Take 1 tablet (5 mg total) by mouth 2 (two) times daily. Needs appt 28 tablet 0   Calcium Carbonate-Vitamin D (CALCIUM + D PO) Take 600 mg by mouth 2 (two) times daily.      chlorthalidone (HYGROTON) 25 MG tablet TAKE 1/2 TABLET BY MOUTH EVERY DAY IN THE MORNING     Cholecalciferol (VITAMIN D-3) 125 MCG (5000 UT) TABS Take 5,000 Units by mouth daily.     Coenzyme Q10 (COQ10) 200 MG CAPS Take 200 mg by mouth 2 (two) times daily.     diazepam (VALIUM) 10 MG tablet TAKE 1 TABLET BY MOUTH EVERY 6 HOURS AS NEEDED FOR ANXIETY 30 tablet 2   metoprolol succinate (TOPROL-XL) 25 MG 24 hr tablet TAKE 1 TABLET BY MOUTH DAILY 180 tablet 3   rosuvastatin (CRESTOR) 20 MG tablet Take 20 mg by mouth at bedtime.     sertraline (ZOLOFT) 100 MG tablet Take 100 mg by mouth daily.     No current facility-administered medications for this visit.    Allergies:   Codeine and Prednisone   Social History:  The patient  reports that she has been smoking cigarettes. She has been smoking an average of .1 packs per day. She has never used smokeless tobacco. She reports current alcohol use of about 3.0 standard drinks of alcohol per week. She reports that she does not use drugs.   Family History:  The patient's family history includes Heart disease in her father and mother; Hypertension in her mother.  ROS:  Please see the history of  present illness.    All other systems are reviewed and otherwise negative.   PHYSICAL EXAM:  VS:  BP 120/80   Pulse (!) 59   Ht 5\' 1"  (1.549 m)   Wt 169 lb 6.4 oz (76.8 kg)   SpO2 95%   BMI 32.01 kg/m  BMI: Body mass index is 32.01 kg/m. Well nourished, well developed, in no acute distress HEENT: normocephalic, atraumatic Neck: no JVD, carotid bruits or masses Cardiac:  RRR; no significant murmurs, no rubs, or gallops Lungs:  CTA b/l, no wheezing, rhonchi or rales Abd: soft, nontender MS: no deformity or atrophy Ext: no edema Skin: warm and dry, no rash Neuro:  No gross deficits appreciated Psych: euthymic mood, full affect   EKG:  Done today and reviewed by myself shows  SB 59bpm, LAD   TTE 12/17/21   1. Left ventricular ejection fraction,  by estimation, is 40 to 45%. Left  ventricular ejection fraction by 2D MOD biplane is 41.3 %. The left  ventricle has mildly decreased function. The left ventricle demonstrates  global hypokinesis. Left ventricular  diastolic function could not be evaluated.   2. Right ventricular systolic function is mildly reduced. The right  ventricular size is mildly enlarged. Tricuspid regurgitation signal is  inadequate for assessing PA pressure.   3. The mitral valve is grossly normal. Trivial mitral valve  regurgitation. No evidence of mitral stenosis.   4. The aortic valve was not well visualized. Aortic valve regurgitation  is not visualized. No aortic stenosis is present.   5. The inferior vena cava is dilated in size with >50% respiratory  variability, suggesting right atrial pressure of 8 mmHg.    Cardiac monitor 12/21/2021  100% atrial fibrillation burden Less than 1% ventricular ectopy Average heart rate 108 bpm No triggered episodes recorded   Myoview 12/10/2021   The study is intermediate risk based upon ejection fraction of 39%.   No ST deviation was noted.   LV perfusion is abnormal. There is no evidence of ischemia. There is no evidence of infarction. Defect 1: There is a small defect with mild reduction in uptake present in the apical location(s) that is fixed. There is normal wall motion in the defect area.   Left ventricular function is normal. Nuclear stress EF: 39 %. The left ventricular ejection fraction is moderately decreased (30-44%). End diastolic cavity size is normal. End systolic cavity size is normal.    Recent Labs: 12/28/2021: BUN 10; Creatinine, Ser 0.74; Hemoglobin 16.8; Platelets 233; Potassium 4.2; Sodium 139  No results found for requested labs within last 365 days.   CrCl cannot be calculated (Patient's most recent lab result is older than the maximum 21 days allowed.).   Wt Readings from Last 3 Encounters:  06/01/22 169 lb 6.4 oz (76.8 kg)  02/02/22 164 lb  (74.4 kg)  01/11/22 158 lb 3.2 oz (71.8 kg)     Other studies reviewed: Additional studies/records reviewed today include: summarized above  ASSESSMENT AND PLAN:  Persistent AFib CHA2DS2Vasc is 3, on Eliquis, appropriately dosed  Reviewed procedure day, expectations and post procedure Discussed importance of not missing any doses of her eliquis Anticipation at 62mo or so post procedure hopefully she can get off amiodarone  NICM Chronic CHF No symptoms or exam findings of volume OL  Felt to be 2/2 AF, RVR Plan repeat imaging post ablation  HTN Looks good   Disposition: F/u with usual post procedure appointments, sooner if needed  Current medicines are reviewed at  length with the patient today.  The patient did not have any concerns regarding medicines.  Norma FredricksonSigned, Marvelle Caudill, PA-C 06/01/2022 9:50 AM     [facility] [address] [place] [address] [phone] (office)  [phone] (fax)

## 2022-06-01 ENCOUNTER — Ambulatory Visit: Payer: Medicare Other | Attending: Physician Assistant | Admitting: Physician Assistant

## 2022-06-01 ENCOUNTER — Encounter: Payer: Self-pay | Admitting: Physician Assistant

## 2022-06-01 VITALS — BP 120/80 | HR 59 | Ht 61.0 in | Wt 169.4 lb

## 2022-06-01 DIAGNOSIS — I4819 Other persistent atrial fibrillation: Secondary | ICD-10-CM

## 2022-06-01 DIAGNOSIS — I5022 Chronic systolic (congestive) heart failure: Secondary | ICD-10-CM | POA: Diagnosis not present

## 2022-06-01 DIAGNOSIS — I10 Essential (primary) hypertension: Secondary | ICD-10-CM

## 2022-06-01 DIAGNOSIS — D6869 Other thrombophilia: Secondary | ICD-10-CM

## 2022-06-01 NOTE — Patient Instructions (Addendum)
Medication Instructions:  Your physician recommends that you continue on your current medications as directed. Please refer to the Current Medication list given to you today.  *If you need a refill on your cardiac medications before your next appointment, please call your pharmacy*   Lab Work: CMET, CBC, TSH--TODAY If you have labs (blood work) drawn today and your tests are completely normal, you will receive your results only by: MyChart Message (if you have MyChart) OR A paper copy in the mail If you have any lab test that is abnormal or we need to change your treatment, we will call you to review the results.   Testing/Procedures: See instruction letters   Follow-Up: At [facility], you and your health needs are our priority.  As part of our continuing mission to provide you with exceptional heart care, we have created designated Provider Care Teams.  These Care Teams include your primary Cardiologist (physician) and Advanced Practice Providers (APPs -  Physician Assistants and Nurse Practitioners) who all work together to provide you with the care you need, when you need it.  Your next appointment:   As planned

## 2022-06-02 LAB — CBC
Hematocrit: 44.8 % (ref 34.0–46.6)
Hemoglobin: 15.2 g/dL (ref 11.1–15.9)
MCH: 30.4 pg (ref 26.6–33.0)
MCHC: 33.9 g/dL (ref 31.5–35.7)
MCV: 90 fL (ref 79–97)
Platelets: 235 10*3/uL (ref 150–450)
RBC: 5 x10E6/uL (ref 3.77–5.28)
RDW: 13.3 % (ref 11.7–15.4)
WBC: 6.3 10*3/uL (ref 3.4–10.8)

## 2022-06-02 LAB — COMPREHENSIVE METABOLIC PANEL
ALT: 81 IU/L — ABNORMAL HIGH (ref 0–32)
AST: 52 IU/L — ABNORMAL HIGH (ref 0–40)
Albumin/Globulin Ratio: 1.7 (ref 1.2–2.2)
Albumin: 4.8 g/dL (ref 3.8–4.8)
Alkaline Phosphatase: 71 IU/L (ref 44–121)
BUN/Creatinine Ratio: 12 (ref 12–28)
BUN: 10 mg/dL (ref 8–27)
Bilirubin Total: 0.4 mg/dL (ref 0.0–1.2)
CO2: 27 mmol/L (ref 20–29)
Calcium: 10.7 mg/dL — ABNORMAL HIGH (ref 8.7–10.3)
Chloride: 96 mmol/L (ref 96–106)
Creatinine, Ser: 0.81 mg/dL (ref 0.57–1.00)
Globulin, Total: 2.8 g/dL (ref 1.5–4.5)
Glucose: 111 mg/dL — ABNORMAL HIGH (ref 70–99)
Potassium: 4.2 mmol/L (ref 3.5–5.2)
Sodium: 138 mmol/L (ref 134–144)
Total Protein: 7.6 g/dL (ref 6.0–8.5)
eGFR: 76 mL/min/{1.73_m2} (ref >59)

## 2022-06-02 LAB — TSH: TSH: 2.89 u[IU]/mL (ref 0.450–4.500)

## 2022-06-14 DIAGNOSIS — H43393 Other vitreous opacities, bilateral: Secondary | ICD-10-CM | POA: Diagnosis not present

## 2022-06-14 DIAGNOSIS — D3131 Benign neoplasm of right choroid: Secondary | ICD-10-CM | POA: Diagnosis not present

## 2022-06-14 DIAGNOSIS — H35033 Hypertensive retinopathy, bilateral: Secondary | ICD-10-CM | POA: Diagnosis not present

## 2022-06-14 DIAGNOSIS — H43813 Vitreous degeneration, bilateral: Secondary | ICD-10-CM | POA: Diagnosis not present

## 2022-06-15 ENCOUNTER — Telehealth (HOSPITAL_COMMUNITY): Payer: Self-pay | Admitting: *Deleted

## 2022-06-15 NOTE — Telephone Encounter (Signed)
Reaching out to patient to offer assistance regarding upcoming cardiac imaging study; pt verbalizes understanding of appt date/time, parking situation and where to check in, pre-test NPO status, and verified current allergies; name and call back number provided for further questions should they arise ? ?Felita Bump RN Navigator Cardiac Imaging ?[facility] ?[phone] office ?[phone] cell ? ?Patient aware to arrive at 10:30am. ?

## 2022-06-16 ENCOUNTER — Ambulatory Visit (HOSPITAL_COMMUNITY)
Admission: RE | Admit: 2022-06-16 | Discharge: 2022-06-16 | Disposition: A | Discharge status: Home / Self Care | Payer: Medicare Other | Source: Ambulatory Visit | Attending: Cardiology | Admitting: Cardiology

## 2022-06-16 DIAGNOSIS — I4819 Other persistent atrial fibrillation: Secondary | ICD-10-CM | POA: Diagnosis not present

## 2022-06-16 MED ORDER — IOHEXOL 350 MG/ML SOLN
95.0000 mL | Freq: Once | INTRAVENOUS | Status: AC | PRN
  Administered 2022-06-16: 95 mL via INTRAVENOUS

## 2022-06-22 NOTE — Anesthesia Preprocedure Evaluation (Signed)
Anesthesia Evaluation  Patient identified by MRN, date of birth, ID band Patient awake    Reviewed: Allergy & Precautions, NPO status , Patient's Chart, lab work & pertinent test results, reviewed documented beta blocker date and time   Airway Mallampati: III  TM Distance: >3 FB Neck ROM: Full    Dental  (+) Teeth Intact, Dental Advisory Given   Pulmonary Current Smoker   Pulmonary exam normal breath sounds clear to auscultation       Cardiovascular hypertension, Pt. on medications and Pt. on home beta blockers Normal cardiovascular exam+ dysrhythmias Atrial Fibrillation  Rhythm:Regular Rate:Normal  Echo 10.26.23: 1. Left ventricular ejection fraction, by estimation, is 40 to 45%. Left  ventricular ejection fraction by 2D MOD biplane is 41.3 %. The left  ventricle has mildly decreased function. The left ventricle demonstrates  global hypokinesis. Left ventricular  diastolic function could not be evaluated.   2. Right ventricular systolic function is mildly reduced. The right  ventricular size is mildly enlarged. Tricuspid regurgitation signal is  inadequate for assessing PA pressure.   3. The mitral valve is grossly normal. Trivial mitral valve  regurgitation. No evidence of mitral stenosis.   4. The aortic valve was not well visualized. Aortic valve regurgitation  is not visualized. No aortic stenosis is present.   5. The inferior vena cava is dilated in size with >50% respiratory  variability, suggesting right atrial pressure of 8 mmHg.      Neuro/Psych  PSYCHIATRIC DISORDERS Anxiety     negative neurological ROS     GI/Hepatic negative GI ROS, Neg liver ROS,,,  Endo/Other  negative endocrine ROS    Renal/GU Renal disease     Musculoskeletal negative musculoskeletal ROS (+)    Abdominal   Peds  Hematology  (+) Blood dyscrasia (Eliquis)   Anesthesia Other Findings   Reproductive/Obstetrics                              Anesthesia Physical Anesthesia Plan  ASA: 3  Anesthesia Plan: General   Post-op Pain Management: Tylenol PO (pre-op)*   Induction: Intravenous  PONV Risk Score and Plan: 2 and Dexamethasone and Ondansetron  Airway Management Planned: Oral ETT  Additional Equipment:   Intra-op Plan:   Post-operative Plan: Extubation in OR  Informed Consent: I have reviewed the patients History and Physical, chart, labs and discussed the procedure including the risks, benefits and alternatives for the proposed anesthesia with the patient or authorized representative who has indicated his/her understanding and acceptance.     Dental advisory given  Plan Discussed with: CRNA  Anesthesia Plan Comments:         Anesthesia Quick Evaluation

## 2022-06-22 NOTE — Pre-Procedure Instructions (Signed)
Instructed patient on the following items: Arrival time 0630 Nothing to eat or drink after midnight No meds AM of procedure Responsible person to drive you home and stay with you for 24 hrs  Have you missed any doses of anti-coagulant Takes Eliquis- hasn't missed any doses, takes twice a day.  Don't take dose in the morning.

## 2022-06-23 ENCOUNTER — Encounter (HOSPITAL_COMMUNITY)
Admission: RE | Disposition: A | Discharge status: Home / Self Care | Payer: Self-pay | Source: Home / Self Care | Attending: Cardiology

## 2022-06-23 ENCOUNTER — Ambulatory Visit (HOSPITAL_BASED_OUTPATIENT_CLINIC_OR_DEPARTMENT_OTHER): Payer: Medicare Other | Admitting: Anesthesiology

## 2022-06-23 ENCOUNTER — Ambulatory Visit (HOSPITAL_COMMUNITY): Payer: Medicare Other | Admitting: Anesthesiology

## 2022-06-23 ENCOUNTER — Other Ambulatory Visit: Payer: Self-pay

## 2022-06-23 ENCOUNTER — Ambulatory Visit (HOSPITAL_COMMUNITY)
Admission: RE | Admit: 2022-06-23 | Discharge: 2022-06-23 | Disposition: A | Discharge status: Home / Self Care | Payer: Medicare Other | Attending: Cardiology | Admitting: Cardiology

## 2022-06-23 DIAGNOSIS — F17200 Nicotine dependence, unspecified, uncomplicated: Secondary | ICD-10-CM | POA: Diagnosis not present

## 2022-06-23 DIAGNOSIS — F17210 Nicotine dependence, cigarettes, uncomplicated: Secondary | ICD-10-CM | POA: Diagnosis not present

## 2022-06-23 DIAGNOSIS — I110 Hypertensive heart disease with heart failure: Secondary | ICD-10-CM | POA: Insufficient documentation

## 2022-06-23 DIAGNOSIS — I428 Other cardiomyopathies: Secondary | ICD-10-CM | POA: Diagnosis not present

## 2022-06-23 DIAGNOSIS — I4819 Other persistent atrial fibrillation: Secondary | ICD-10-CM | POA: Insufficient documentation

## 2022-06-23 DIAGNOSIS — Z8249 Family history of ischemic heart disease and other diseases of the circulatory system: Secondary | ICD-10-CM | POA: Diagnosis not present

## 2022-06-23 DIAGNOSIS — I4891 Unspecified atrial fibrillation: Secondary | ICD-10-CM | POA: Diagnosis not present

## 2022-06-23 DIAGNOSIS — Z7901 Long term (current) use of anticoagulants: Secondary | ICD-10-CM | POA: Diagnosis not present

## 2022-06-23 DIAGNOSIS — F419 Anxiety disorder, unspecified: Secondary | ICD-10-CM | POA: Diagnosis not present

## 2022-06-23 DIAGNOSIS — I5022 Chronic systolic (congestive) heart failure: Secondary | ICD-10-CM | POA: Diagnosis not present

## 2022-06-23 DIAGNOSIS — I10 Essential (primary) hypertension: Secondary | ICD-10-CM | POA: Diagnosis not present

## 2022-06-23 HISTORY — PX: ATRIAL FIBRILLATION ABLATION: EP1191

## 2022-06-23 LAB — POCT ACTIVATED CLOTTING TIME
Activated Clotting Time: 325 seconds
Activated Clotting Time: 401 seconds

## 2022-06-23 SURGERY — ATRIAL FIBRILLATION ABLATION
Anesthesia: General

## 2022-06-23 MED ORDER — DEXAMETHASONE SODIUM PHOSPHATE 10 MG/ML IJ SOLN
INTRAMUSCULAR | Status: DC | PRN
  Administered 2022-06-23: 10 mg via INTRAVENOUS

## 2022-06-23 MED ORDER — SUGAMMADEX SODIUM 200 MG/2ML IV SOLN
INTRAVENOUS | Status: DC | PRN
  Administered 2022-06-23: 200 mg via INTRAVENOUS

## 2022-06-23 MED ORDER — ROCURONIUM BROMIDE 10 MG/ML (PF) SYRINGE
PREFILLED_SYRINGE | INTRAVENOUS | Status: DC | PRN
  Administered 2022-06-23: 60 mg via INTRAVENOUS

## 2022-06-23 MED ORDER — HEPARIN SODIUM (PORCINE) 1000 UNIT/ML IJ SOLN
INTRAMUSCULAR | Status: DC | PRN
  Administered 2022-06-23: 1000 [IU] via INTRAVENOUS

## 2022-06-23 MED ORDER — HEPARIN SODIUM (PORCINE) 1000 UNIT/ML IJ SOLN
INTRAMUSCULAR | Status: DC | PRN
  Administered 2022-06-23: 14000 [IU] via INTRAVENOUS

## 2022-06-23 MED ORDER — ONDANSETRON HCL 4 MG/2ML IJ SOLN
INTRAMUSCULAR | Status: DC | PRN
  Administered 2022-06-23: 4 mg via INTRAVENOUS

## 2022-06-23 MED ORDER — ACETAMINOPHEN 500 MG PO TABS
1000.0000 mg | ORAL_TABLET | Freq: Once | ORAL | Status: AC
  Administered 2022-06-23: 1000 mg via ORAL
  Filled 2022-06-23: qty 2

## 2022-06-23 MED ORDER — ONDANSETRON HCL 4 MG/2ML IJ SOLN: 4.0000 mg | Freq: Four times a day (QID) | INTRAMUSCULAR | Status: DC | PRN

## 2022-06-23 MED ORDER — PHENYLEPHRINE 80 MCG/ML (10ML) SYRINGE FOR IV PUSH (FOR BLOOD PRESSURE SUPPORT)
PREFILLED_SYRINGE | INTRAVENOUS | Status: DC | PRN
  Administered 2022-06-23: 40 ug via INTRAVENOUS
  Administered 2022-06-23: 80 ug via INTRAVENOUS

## 2022-06-23 MED ORDER — PROTAMINE SULFATE 10 MG/ML IV SOLN
INTRAVENOUS | Status: DC | PRN
  Administered 2022-06-23: 40 mg via INTRAVENOUS

## 2022-06-23 MED ORDER — FENTANYL CITRATE (PF) 250 MCG/5ML IJ SOLN
INTRAMUSCULAR | Status: DC | PRN
  Administered 2022-06-23 (×2): 50 ug via INTRAVENOUS

## 2022-06-23 MED ORDER — DOBUTAMINE INFUSION FOR EP/ECHO/NUC (1000 MCG/ML)
INTRAVENOUS | Status: AC
  Filled 2022-06-23: qty 250

## 2022-06-23 MED ORDER — LIDOCAINE 2% (20 MG/ML) 5 ML SYRINGE
INTRAMUSCULAR | Status: DC | PRN
  Administered 2022-06-23: 80 mg via INTRAVENOUS

## 2022-06-23 MED ORDER — HEPARIN SODIUM (PORCINE) 1000 UNIT/ML IJ SOLN
INTRAMUSCULAR | Status: AC
  Filled 2022-06-23: qty 10

## 2022-06-23 MED ORDER — HEPARIN (PORCINE) IN NACL 1000-0.9 UT/500ML-% IV SOLN
INTRAVENOUS | Status: DC | PRN
  Administered 2022-06-23 (×4): 500 mL

## 2022-06-23 MED ORDER — DOBUTAMINE INFUSION FOR EP/ECHO/NUC (1000 MCG/ML)
INTRAVENOUS | Status: DC | PRN
  Administered 2022-06-23: 20 ug/kg/min via INTRAVENOUS

## 2022-06-23 MED ORDER — ACETAMINOPHEN 325 MG PO TABS: 650.0000 mg | ORAL_TABLET | ORAL | Status: DC | PRN

## 2022-06-23 MED ORDER — SODIUM CHLORIDE 0.9 % IV SOLN: INTRAVENOUS | Status: DC

## 2022-06-23 MED ORDER — PROPOFOL 10 MG/ML IV BOLUS
INTRAVENOUS | Status: DC | PRN
  Administered 2022-06-23: 50 mg via INTRAVENOUS
  Administered 2022-06-23: 130 mg via INTRAVENOUS

## 2022-06-23 SURGICAL SUPPLY — 19 items
BLANKET WARM UNDERBOD FULL ACC (MISCELLANEOUS) ×1 IMPLANT
CATH ABLAT QDOT MICRO BI TC DF (CATHETERS) IMPLANT
CATH OCTARAY 2.0 F 3-3-3-3-3 (CATHETERS) IMPLANT
CATH PIGTAIL STEERABLE D1 8.7 (WIRE) IMPLANT
CATH S-M CIRCA TEMP PROBE (CATHETERS) IMPLANT
CATH SOUNDSTAR ECO 8FR (CATHETERS) IMPLANT
CATH WEB BI DIR CSDF CRV REPRO (CATHETERS) IMPLANT
CLOSURE PERCLOSE PROSTYLE (VASCULAR PRODUCTS) IMPLANT
COVER SWIFTLINK CONNECTOR (BAG) ×1 IMPLANT
PACK EP LATEX FREE (CUSTOM PROCEDURE TRAY) ×1
PACK EP LF (CUSTOM PROCEDURE TRAY) ×1 IMPLANT
PAD DEFIB RADIO PHYSIO CONN (PAD) ×1 IMPLANT
PATCH CARTO3 (PAD) IMPLANT
SHEATH CARTO VIZIGO SM CVD (SHEATH) IMPLANT
SHEATH PINNACLE 7F 10CM (SHEATH) IMPLANT
SHEATH PINNACLE 8F 10CM (SHEATH) IMPLANT
SHEATH PINNACLE 9F 10CM (SHEATH) IMPLANT
SHEATH PROBE COVER 6X72 (BAG) IMPLANT
TUBING SMART ABLATE COOLFLOW (TUBING) IMPLANT

## 2022-06-23 NOTE — Discharge Instructions (Signed)

## 2022-06-23 NOTE — Transfer of Care (Signed)
Immediate Anesthesia Transfer of Care Note  Patient: TAKIESHA MCDEVITT  Procedure(s) Performed: ATRIAL FIBRILLATION ABLATION  Patient Location: PACU and Cath Lab  Anesthesia Type:General  Level of Consciousness: drowsy, patient cooperative, and responds to stimulation  Airway & Oxygen Therapy: Patient Spontanous Breathing, Patient connected to nasal cannula oxygen, and Patient connected to face mask oxygen. Sats 85% on 3LNC, 95% on 10LFM.  Encouraged coughing and deep breathing.  Post-op Assessment: Report given to RN, Post -op Vital signs reviewed and stable, and Patient moving all extremities X 4  Post vital signs: Reviewed and stable  Last Vitals:  Vitals Value Taken Time  BP 111/53   Temp    Pulse 73 06/23/22 1038  Resp 15   SpO2 93 % 06/23/22 1038  Vitals shown include unvalidated device data.  Last Pain:  Vitals:   06/23/22 0730  TempSrc:   PainSc: 0-No pain         Complications:  Encounter Notable Events  Notable Event Outcome Phase Comment  Difficult to intubate - expected  Intraprocedure Filed from anesthesia note documentation.

## 2022-06-23 NOTE — Anesthesia Postprocedure Evaluation (Signed)
Anesthesia Post Note  Patient: Kathy [name]  Procedure(s) Performed: ATRIAL FIBRILLATION ABLATION     Patient location during evaluation: Cath Lab Anesthesia Type: General Level of consciousness: awake and alert Pain management: pain level controlled Vital Signs Assessment: post-procedure vital signs reviewed and stable Respiratory status: spontaneous breathing, nonlabored ventilation, respiratory function stable and patient connected to nasal cannula oxygen Cardiovascular status: blood pressure returned to baseline and stable Postop Assessment: no apparent nausea or vomiting Anesthetic complications: yes   Encounter Notable Events  Notable Event Outcome Phase Comment  Difficult to intubate - expected  Intraprocedure Filed from anesthesia note documentation.    Last Vitals:  Vitals:   06/23/22 1200 06/23/22 1230  BP: 121/61 119/62  Pulse: (!) 57 (!) 57  Resp: 17 17  Temp:    SpO2: 90% 92%    Last Pain:  Vitals:   06/23/22 1115  TempSrc:   PainSc: 0-No pain                 Collene Schlichter

## 2022-06-23 NOTE — Interval H&P Note (Signed)
History and Physical Interval Note:  06/23/2022 6:51 AM  Kathy [name]  has presented today for surgery, with the diagnosis of afib.  The various methods of treatment have been discussed with the patient and family. After consideration of risks, benefits and other options for treatment, the patient has consented to  Procedure(s): ATRIAL FIBRILLATION ABLATION (N/A) as a surgical intervention.  The patient's history has been reviewed, patient examined, no change in status, stable for surgery.  I have reviewed the patient's chart and labs.  Questions were answered to the patient's satisfaction.     Jailynn Lavalais [employer]

## 2022-06-23 NOTE — Anesthesia Procedure Notes (Signed)
Procedure Name: Intubation Date/Time: 06/23/2022 8:44 AM  Performed by: Lonia Mad, CRNAPre-anesthesia Checklist: Patient identified, Emergency Drugs available, Suction available and Patient being monitored Patient Re-evaluated:Patient Re-evaluated prior to induction Oxygen Delivery Method: Circle System Utilized Preoxygenation: Pre-oxygenation with 100% oxygen Induction Type: IV induction Ventilation: Mask ventilation without difficulty Laryngoscope Size: Mac, 3 and Glidescope Grade View: Grade I Tube type: Oral Tube size: 7.0 mm Number of attempts: 1 Airway Equipment and Method: Stylet and Oral airway Placement Confirmation: ETT inserted through vocal cords under direct vision, positive ETCO2 and breath sounds checked- equal and bilateral Secured at: 22 cm Tube secured with: Tape Dental Injury: Teeth and Oropharynx as per pre-operative assessment  Difficulty Due To: Difficulty was anticipated, Difficult Airway- due to reduced neck mobility and Difficult Airway- due to limited oral opening

## 2022-06-24 ENCOUNTER — Encounter (HOSPITAL_COMMUNITY): Payer: Self-pay | Admitting: Cardiology

## 2022-07-02 ENCOUNTER — Other Ambulatory Visit (HOSPITAL_COMMUNITY): Payer: Self-pay | Admitting: *Deleted

## 2022-07-02 MED ORDER — APIXABAN 5 MG PO TABS: 5.0000 mg | ORAL_TABLET | Freq: Two times a day (BID) | ORAL | 2 refills | Status: DC

## 2022-07-03 DIAGNOSIS — M79641 Pain in right hand: Secondary | ICD-10-CM | POA: Diagnosis not present

## 2022-07-21 ENCOUNTER — Encounter (HOSPITAL_COMMUNITY): Payer: Self-pay | Admitting: Physician Assistant

## 2022-07-21 ENCOUNTER — Ambulatory Visit (HOSPITAL_COMMUNITY)
Admission: RE | Admit: 2022-07-21 | Discharge: 2022-07-21 | Disposition: A | Discharge status: Home / Self Care | Payer: Medicare Other | Source: Ambulatory Visit | Attending: Physician Assistant | Admitting: Physician Assistant

## 2022-07-21 VITALS — BP 120/72 | HR 62 | Ht 61.0 in | Wt 168.0 lb

## 2022-07-21 DIAGNOSIS — Z79899 Other long term (current) drug therapy: Secondary | ICD-10-CM

## 2022-07-21 DIAGNOSIS — I110 Hypertensive heart disease with heart failure: Secondary | ICD-10-CM | POA: Diagnosis not present

## 2022-07-21 DIAGNOSIS — I5020 Unspecified systolic (congestive) heart failure: Secondary | ICD-10-CM | POA: Insufficient documentation

## 2022-07-21 DIAGNOSIS — I4819 Other persistent atrial fibrillation: Secondary | ICD-10-CM | POA: Diagnosis not present

## 2022-07-21 DIAGNOSIS — Z5181 Encounter for therapeutic drug level monitoring: Secondary | ICD-10-CM

## 2022-07-21 DIAGNOSIS — D6869 Other thrombophilia: Secondary | ICD-10-CM | POA: Insufficient documentation

## 2022-07-21 DIAGNOSIS — I4510 Unspecified right bundle-branch block: Secondary | ICD-10-CM | POA: Insufficient documentation

## 2022-07-21 DIAGNOSIS — Z7901 Long term (current) use of anticoagulants: Secondary | ICD-10-CM | POA: Diagnosis not present

## 2022-07-21 DIAGNOSIS — I2510 Atherosclerotic heart disease of native coronary artery without angina pectoris: Secondary | ICD-10-CM | POA: Insufficient documentation

## 2022-07-21 DIAGNOSIS — F17210 Nicotine dependence, cigarettes, uncomplicated: Secondary | ICD-10-CM | POA: Diagnosis not present

## 2022-07-21 LAB — COMPREHENSIVE METABOLIC PANEL
ALT: 69 U/L — ABNORMAL HIGH (ref 0–44)
AST: 50 U/L — ABNORMAL HIGH (ref 15–41)
Albumin: 3.9 g/dL (ref 3.5–5.0)
Alkaline Phosphatase: 53 U/L (ref 38–126)
Anion gap: 13 (ref 5–15)
BUN: 11 mg/dL (ref 8–23)
CO2: 26 mmol/L (ref 22–32)
Calcium: 9.6 mg/dL (ref 8.9–10.3)
Chloride: 97 mmol/L — ABNORMAL LOW (ref 98–111)
Creatinine, Ser: 0.73 mg/dL (ref 0.44–1.00)
GFR, Estimated: >60 mL/min (ref >60)
Glucose, Bld: 104 mg/dL — ABNORMAL HIGH (ref 70–99)
Potassium: 3.1 mmol/L — ABNORMAL LOW (ref 3.5–5.1)
Sodium: 136 mmol/L (ref 135–145)
Total Bilirubin: 0.7 mg/dL (ref 0.3–1.2)
Total Protein: 6.8 g/dL (ref 6.5–8.1)

## 2022-07-21 NOTE — Progress Notes (Signed)
Primary Care Physician: Joycelyn Rua, MD Primary Cardiologist: none Primary Electrophysiologist: Dr Elberta Fortis Referring Physician: Dr Johney Frame   Kathy [name] is a 72 y.o. female with a history of HTN, tobacco abuse, and atrial fibrillation who presents for follow up in the [facility].  The patient was initially diagnosed with atrial fibrillation on 08/18/18 after presenting with symptoms of palpitations and wearing a 3 day heart monitor which showed 3% AF burden. Patient reports that these palpitations had been going on for about one month prior to her diagnosis, about 2 episodes per week. She denies any specific triggers. She denies snoring or significant alcohol use. She is on Eliquis for a CHADS2VASC score of 3.  Patient presented for oral surgery today (have implant placed) and she was found to be in afib with mildly elevated heart rates. She was given labetalol at the dentist office and recommended to follow up here. She is unaware of her arrhythmia but did have some intermittent chest pain. Myoview did not show any ischemia but did show reduced EF. Echo also showed EF 40-45%, no focal wall motion abnormalities. Zio monitor showed 100% afib burden with average HR 108 bpm.   Patient is s/p DCCV on 01/04/22. She was back in afib when she saw Dr Elberta Fortis on 02/02/22 and was started on amiodarone as a bridge to ablation. She was set up for another DCCV but chemically converted prior. She is s/p afib ablation 06/23/22.  On follow up today, patient reports that she has done well since her ablation. She remains in SR. She denies any chest pain, swallowing pain, or groin issues. No bleeding issues on anticoagulation.   Today, she denies symptoms of palpitations, shortness of breath, orthopnea, PND, lower extremity edema, dizziness, presyncope, syncope, snoring, daytime somnolence, bleeding, or neurologic sequela. The patient is tolerating medications without difficulties  and is otherwise without complaint today.    Atrial Fibrillation Risk Factors:  she does not have symptoms or diagnosis of sleep apnea. she does not have a history of rheumatic fever. she does not have a history of alcohol use. The patient does not have a history of early familial atrial fibrillation or other arrhythmias.  she has a BMI of Body mass index is 31.74 kg/m.Marland Kitchen Filed Weights   07/21/22 1413  Weight: 76.2 kg   Family History  Problem Relation Age of Onset   Hypertension Mother    Heart disease Mother    Heart disease Father    Colon cancer Neg Hx    Esophageal cancer Neg Hx    Rectal cancer Neg Hx    Stomach cancer Neg Hx      Atrial Fibrillation Management history:  Previous antiarrhythmic drugs: amiodarone  Previous cardioversions: 01/04/22 Previous ablations: 06/23/22 Anticoagulation history: Eliquis   Past Medical History:  Diagnosis Date   Cystocele    Elevated cholesterol    Fibroid    Hypertension    Kidney stone    passed stone   Osteopenia    Perimenopausal vasomotor symptoms    Rectocele    SVD (spontaneous vaginal delivery)    x 2   Past Surgical History:  Procedure Laterality Date   ARM SURGERY  2005   ATRIAL FIBRILLATION ABLATION N/A 06/23/2022   Procedure: ATRIAL FIBRILLATION ABLATION;  Surgeon: Regan Lemming, MD;  Location: [facility] INVASIVE CV LAB;  Service: Cardiovascular;  Laterality: N/A;   AUGMENTATION MAMMAPLASTY     SALINE in 20's   BREAST SURGERY  augmentation in 20's   CARDIOVERSION N/A 01/04/2022   Procedure: CARDIOVERSION;  Surgeon: Quintella Reichert, MD;  Location: [facility] ENDOSCOPY;  Service: Cardiovascular;  Laterality: N/A;   COLONOSCOPY     RECTOCELE REPAIR N/A 10/31/2013   Procedure: POSTERIOR REPAIR (RECTOCELE);  Surgeon: Ok Edwards, MD;  Location: [facility] ORS;  Service: Gynecology;  Laterality: N/A;   right arm surgery     tendon repair   TONSILLECTOMY     TOTAL HIP ARTHROPLASTY  2001   RIGHT   TUBAL LIGATION      VAGINAL HYSTERECTOMY  1992   WISDOM TOOTH EXTRACTION      Current Outpatient Medications  Medication Sig Dispense Refill   amiodarone (PACERONE) 200 MG tablet Take 200 mg by mouth daily.     apixaban (ELIQUIS) 5 MG TABS tablet Take 1 tablet (5 mg total) by mouth 2 (two) times daily. 180 tablet 2   Calcium Carbonate-Vitamin D (CALCIUM + D PO) Take 600 mg by mouth 2 (two) times daily.     chlorthalidone (HYGROTON) 25 MG tablet Take 12.5 mg by mouth daily.     Cholecalciferol (VITAMIN D-3) 125 MCG (5000 UT) TABS Take 5,000 Units by mouth daily.     Coenzyme Q10 (COQ10) 200 MG CAPS Take 200 mg by mouth daily.     metoprolol succinate (TOPROL-XL) 25 MG 24 hr tablet TAKE 1 TABLET BY MOUTH DAILY 180 tablet 3   rosuvastatin (CRESTOR) 20 MG tablet Take 20 mg by mouth at bedtime.     sertraline (ZOLOFT) 100 MG tablet Take 100 mg by mouth daily.     diazepam (VALIUM) 10 MG tablet TAKE 1 TABLET BY MOUTH EVERY 6 HOURS AS NEEDED FOR ANXIETY (Patient not taking: Reported on 07/21/2022) 30 tablet 2   No current facility-administered medications for this encounter.    Allergies  Allergen Reactions   Codeine Nausea And Vomiting   Prednisone Palpitations    Social History   Socioeconomic History   Marital status: Married    Spouse name: Not on file   Number of children: Not on file   Years of education: Not on file   Highest education level: Not on file  Occupational History   Not on file  Tobacco Use   Smoking status: Some Days    Packs/day: .1    Types: Cigarettes   Smokeless tobacco: Never   Tobacco comments:    3 cigarettes daily 12/29/2020  Vaping Use   Vaping Use: Never used  Substance and Sexual Activity   Alcohol use: Yes    Alcohol/week: 3.0 standard drinks of alcohol    Types: 3 Glasses of wine per week    Comment: red wine x 3 nights per week 12/07/21   Drug use: No   Sexual activity: Not Currently    Birth control/protection: Surgical    Comment: 1st intercourse- 13,  partners-18,  married- 26 yrs   Other Topics Concern   Not on file  Social History Narrative   Not on file   Social Determinants of Health   Financial Resource Strain: Not on file  Food Insecurity: Not on file  Transportation Needs: Not on file  Physical Activity: Not on file  Stress: Not on file  Social Connections: Not on file  Intimate Partner Violence: Not on file     ROS- All systems are reviewed and negative except as per the HPI above.  Physical Exam: Vitals:   07/21/22 1413  BP: 120/72  Pulse: 62  Weight:  76.2 kg  Height: 5\' 1"  (1.549 m)     GEN- The patient is a well appearing female, alert and oriented x 3 today.   HEENT-head normocephalic, atraumatic, sclera clear, conjunctiva pink, hearing intact, trachea midline. Lungs- Clear to ausculation bilaterally, normal work of breathing Heart- Regular rate and rhythm, no murmurs, rubs or gallops  GI- soft, NT, ND, + BS Extremities- no clubbing, cyanosis, or edema MS- no significant deformity or atrophy Skin- no rash or lesion Psych- euthymic mood, full affect Neuro- strength and sensation are intact   Wt Readings from Last 3 Encounters:  07/21/22 76.2 kg  06/23/22 65.8 kg  06/01/22 76.8 kg    EKG today demonstrates  SR, LAFB Vent. rate 62 BPM PR interval 192 ms QRS duration 92 ms QT/QTcB 484/491 ms   Echo 12/17/21  1. Left ventricular ejection fraction, by estimation, is 40 to 45%. Left  ventricular ejection fraction by 2D MOD biplane is 41.3 %. The left  ventricle has mildly decreased function. The left ventricle demonstrates  global hypokinesis. Left ventricular diastolic function could not be evaluated.   2. Right ventricular systolic function is mildly reduced. The right  ventricular size is mildly enlarged. Tricuspid regurgitation signal is  inadequate for assessing PA pressure.   3. The mitral valve is grossly normal. Trivial mitral valve  regurgitation. No evidence of mitral stenosis.   4.  The aortic valve was not well visualized. Aortic valve regurgitation  is not visualized. No aortic stenosis is present.   5. The inferior vena cava is dilated in size with >50% respiratory  variability, suggesting right atrial pressure of 8 mmHg.   Comparison(s): Changes from prior study are noted. The left ventricular  function is worsened.    Epic records are reviewed at length today  CHA2DS2-VASc Score = 5  The patient's score is based upon: CHF History: 1 HTN History: 1 Diabetes History: 0 Stroke History: 0 Vascular Disease History: 1 Age Score: 1 Gender Score: 1        ASSESSMENT AND PLAN: 1. Persistent atrial fibrillation  The patient's CHA2DS2-VASc score is 5, indicating a 7.2% annual risk of stroke.   S/p afib ablation 06/23/22 Patient appears to be maintaining SR.  Continue amiodarone 200 mg daily for now. Check LFTs today.  Continue Toprol 25 mg daily Continue Eliquis 5 mg BID with no missed doses for 3 months post ablation.   2. Secondary Hypercoagulable State (ICD10:  D68.69) The patient is at significant risk for stroke/thromboembolism based upon her CHA2DS2-VASc Score of 5.  Continue Apixaban (Eliquis).   3. HTN Stable, no changes today.  4. HFrEF EF 40-45% Suspect related to afib, stress test did not show any ischemia. Will need repeat echo once she has been stable in SR. Fluid status appears stable today.  5. CAD CAC score 399 On statin No anginal symptoms.   Follow up with Dr Elberta Fortis as scheduled.     Jorja Loa PA-C Afib Clinic [facility] [address] [place], [place] [address] [phone] 07/21/2022 2:27 PM

## 2022-07-23 ENCOUNTER — Other Ambulatory Visit (HOSPITAL_COMMUNITY): Payer: Self-pay | Admitting: *Deleted

## 2022-07-23 MED ORDER — POTASSIUM CHLORIDE ER 10 MEQ PO TBCR: 10.0000 meq | EXTENDED_RELEASE_TABLET | Freq: Every day | ORAL | 3 refills | Status: DC

## 2022-08-19 ENCOUNTER — Other Ambulatory Visit (HOSPITAL_COMMUNITY): Payer: Self-pay

## 2022-08-19 MED ORDER — APIXABAN 5 MG PO TABS: 5.0000 mg | ORAL_TABLET | Freq: Two times a day (BID) | ORAL | 0 refills | Status: DC

## 2022-08-19 NOTE — Addendum Note (Signed)
Addended by: Hyman Bower F on: 08/19/2022 11:56 AM   Modules accepted: Orders

## 2022-08-21 DIAGNOSIS — R051 Acute cough: Secondary | ICD-10-CM | POA: Diagnosis not present

## 2022-08-21 DIAGNOSIS — Z20822 Contact with and (suspected) exposure to covid-19: Secondary | ICD-10-CM | POA: Diagnosis not present

## 2022-09-15 DIAGNOSIS — E210 Primary hyperparathyroidism: Secondary | ICD-10-CM | POA: Diagnosis not present

## 2022-09-15 DIAGNOSIS — I480 Paroxysmal atrial fibrillation: Secondary | ICD-10-CM | POA: Diagnosis not present

## 2022-09-15 DIAGNOSIS — I5020 Unspecified systolic (congestive) heart failure: Secondary | ICD-10-CM | POA: Diagnosis not present

## 2022-09-15 DIAGNOSIS — I110 Hypertensive heart disease with heart failure: Secondary | ICD-10-CM | POA: Diagnosis not present

## 2022-09-15 DIAGNOSIS — Z0000 Encounter for general adult medical examination without abnormal findings: Secondary | ICD-10-CM | POA: Diagnosis not present

## 2022-09-15 DIAGNOSIS — D6869 Other thrombophilia: Secondary | ICD-10-CM | POA: Diagnosis not present

## 2022-09-16 ENCOUNTER — Telehealth (HOSPITAL_COMMUNITY): Payer: Self-pay | Admitting: *Deleted

## 2022-09-16 NOTE — Telephone Encounter (Addendum)
Pt called in stating she ran out of refills of amiodarone about 2-3 weeks ago so she stopped the medication. She saw her PCP today and they recommended notifying office that she had stopped amiodarone. Pt post ablation - per Ricky since HR within normal limits and pt denies afib would not recommend restarting amiodarone as pt is seeing Dr. Elberta Fortis on 8/6 for 3 month post ablation appt.

## 2022-09-28 ENCOUNTER — Ambulatory Visit: Payer: Medicare Other | Attending: Cardiology | Admitting: Cardiology

## 2022-09-28 ENCOUNTER — Encounter: Payer: Self-pay | Admitting: Cardiology

## 2022-09-28 VITALS — BP 110/70 | HR 57 | Ht 61.0 in

## 2022-09-28 DIAGNOSIS — I4819 Other persistent atrial fibrillation: Secondary | ICD-10-CM | POA: Diagnosis not present

## 2022-09-28 DIAGNOSIS — D6869 Other thrombophilia: Secondary | ICD-10-CM

## 2022-09-28 DIAGNOSIS — I10 Essential (primary) hypertension: Secondary | ICD-10-CM

## 2022-09-28 DIAGNOSIS — I5022 Chronic systolic (congestive) heart failure: Secondary | ICD-10-CM | POA: Diagnosis not present

## 2022-09-28 MED ORDER — APIXABAN 5 MG PO TABS: 5.0000 mg | ORAL_TABLET | Freq: Two times a day (BID) | ORAL | Status: DC

## 2022-09-28 NOTE — Patient Instructions (Addendum)
Medication Instructions:  Your physician recommends that you continue on your current medications as directed. Please refer to the Current Medication list given to you today.  *If you need a refill on your cardiac medications before your next appointment, please call your pharmacy*   Lab Work: None ordered   Testing/Procedures: None ordered   Follow-Up: At [facility], you and your health needs are our priority.  As part of our continuing mission to provide you with exceptional heart care, we have created designated Provider Care Teams.  These Care Teams include your primary Cardiologist (physician) and Advanced Practice Providers (APPs -  Physician Assistants and Nurse Practitioners) who all work together to provide you with the care you need, when you need it.  Your next appointment:   6 month(s)  The format for your next appointment:   In Person  Provider:   You will follow up in the Atrial Fibrillation Clinic located at [facility]. Your provider will be: Clint R. Fenton, PA-C  Or Landry Mellow, PA   Thank you for choosing [facility]!!   Dory Horn, RN [phone]  Other Instructions  [employer] (Eliquis): [phone]

## 2022-09-29 ENCOUNTER — Encounter: Payer: Self-pay | Admitting: Cardiology

## 2022-09-29 NOTE — Progress Notes (Signed)
  Electrophysiology Office Note:   Date:  09/29/2022  ID:  Kathy [name], DOB 07-08-1950, MRN [id number]  Primary Cardiologist: None Electrophysiologist:  Jorja Loa, MD      History of Present Illness:   Kathy [name] is a 72 y.o. female with h/o atrial fibrillation seen today for routine electrophysiology followup.  Since last being seen in our clinic the patient reports doing well.  She has noted no further episodes of atrial fibrillation.  She stopped her amiodarone a few weeks ago, and has had no recurrences.  she denies chest pain, palpitations, dyspnea, PND, orthopnea, nausea, vomiting, dizziness, syncope, edema, weight gain, or early satiety.      She has a history of similar hypertension, tobacco abuse, atrial fibrillation. She was diagnosed with atrial fibrillation 08/18/2018 after presenting with symptoms of palpitations. Patient then presented for oral surgery. She was found to have atrial fibrillation with rapid rates. She had an echo that showed an ejection fraction 40 to 45% and a Zio monitor with a 100% atrial fibrillation burden. She is post ablation 06/23/22.       Review of systems complete and found to be negative unless listed in HPI.   EP Information / Studies Reviewed:    EKG is ordered today. Personal review as below.  EKG Interpretation Date/Time:  Tuesday September 28 2022 12:15:56 EDT Ventricular Rate:  57 PR Interval:  200 QRS Duration:  86 QT Interval:  500 QTC Calculation: 486 R Axis:   -53  Text Interpretation: Sinus bradycardia Left axis deviation Nonspecific ST abnormality When compared with ECG of 21-Jul-2022 14:16, Minimal criteria for Anterior infarct are no longer Present Confirmed by ,  ([id number]) on 09/28/2022 12:21:27 PM     Risk Assessment/Calculations:    CHA2DS2-VASc Score = 5   This indicates a 7.2% annual risk of stroke. The patient's score is based upon: CHF History: 1 HTN History: 1 Diabetes History: 0 Stroke  History: 0 Vascular Disease History: 1 Age Score: 1 Gender Score: 1             Physical Exam:   VS:  BP 110/70 (BP Location: Left Arm, Patient Position: Sitting, Cuff Size: Large)   Pulse (!) 57   Ht 5\' 1"  (1.549 m)   SpO2 96%   BMI 31.74 kg/m    Wt Readings from Last 3 Encounters:  07/21/22 168 lb (76.2 kg)  06/23/22 145 lb (65.8 kg)  06/01/22 169 lb 6.4 oz (76.8 kg)     GEN: Well nourished, well developed in no acute distress NECK: No JVD; No carotid bruits CARDIAC: Regular rate and rhythm, no murmurs, rubs, gallops RESPIRATORY:  Clear to auscultation without rales, wheezing or rhonchi  ABDOMEN: Soft, non-tender, non-distended EXTREMITIES:  No edema; No deformity   ASSESSMENT AND PLAN:    1.  Persistent atrial fibrillation: Currently on Toprol-XL and Eliquis.  Post ablation 06/23/2022.  No further episodes of atrial fibrillation.  Continue current management.  No need to restart amiodarone.  2.  Secondary hypercoagulable state: Currently on Eliquis for atrial fibrillation  3.  Hypertension: Currently well-controlled  4.  Chronic systolic heart failure:  plan for repeat echo now that she is post ablation to ensure that he has recovered.  Follow up with EP APP in 6 months  Signed,  Jorja Loa, MD

## 2022-10-20 DIAGNOSIS — B372 Candidiasis of skin and nail: Secondary | ICD-10-CM | POA: Diagnosis not present

## 2022-11-30 ENCOUNTER — Other Ambulatory Visit (HOSPITAL_COMMUNITY): Payer: Self-pay | Admitting: Physician Assistant

## 2023-01-11 ENCOUNTER — Telehealth: Payer: Self-pay | Admitting: *Deleted

## 2023-01-11 DIAGNOSIS — K5289 Other specified noninfective gastroenteritis and colitis: Secondary | ICD-10-CM | POA: Diagnosis not present

## 2023-01-11 DIAGNOSIS — I4891 Unspecified atrial fibrillation: Secondary | ICD-10-CM | POA: Diagnosis not present

## 2023-01-11 NOTE — Telephone Encounter (Signed)
Pharmacy please advise on holding Eliquis prior to colonoscopy scheduled for 02/09/2023. Thank you.

## 2023-01-11 NOTE — Telephone Encounter (Signed)
   Pre-operative Risk Assessment    Patient Name: Kathy [name]  DOB: [birth year] MRN: [id number]   Last OV: Dr. Elberta Fortis 09/28/2022 Upcoming OV: None  Request for Surgical Clearance    Procedure:  Colonoscopy  Date of Surgery:  Clearance 02/09/23                               Surgeon:   Dr. Kerin Salen  Surgeon's Group or Practice Name:  [facility] Phone number:  [phone] Fax number:  [phone]   Type of Clearance Requested:   - Medical  - Pharmacy:  Hold Apixaban (Eliquis) 2 days prior.   Type of Anesthesia:   Propofol   Additional requests/questions:    Signed, Emmit Pomfret   01/11/2023, 2:43 PM

## 2023-01-12 NOTE — Telephone Encounter (Signed)
   Name: Kathy [name]  DOB: [birth year]  MRN: [id number]  Primary Cardiologist: None EP: Dr. Elberta Fortis Last OV: 09/28/22 Procedure/date: Colonoscopy on 02/09/23  Preoperative team, please contact this patient and set up a phone call appointment for further preoperative risk assessment. Please obtain consent and complete medication review. Thank you for your help.  I confirm that guidance regarding antiplatelet and oral anticoagulation therapy has been completed and, if necessary, noted below.  Per office protocol/pharmD, patient can hold Eliquis for 2 days prior to procedure.   I also confirmed the patient resides in the state of [place]. As per [facility] Medical Board telemedicine laws, the patient must reside in the state in which the provider is licensed.  Rip Harbour, NP 01/12/2023, 11:53 AM [facility]

## 2023-01-12 NOTE — Telephone Encounter (Signed)
Tried to call the pt but no answer.  

## 2023-01-12 NOTE — Telephone Encounter (Signed)
Patient with diagnosis of afib on Eliquis for anticoagulation.    Procedure: colonoscopy Date of procedure: 02/09/23   CHA2DS2-VASc Score = 5   This indicates a 7.2% annual risk of stroke. The patient's score is based upon: CHF History: 1 HTN History: 1 Diabetes History: 0 Stroke History: 0 Vascular Disease History: 1 Age Score: 1 Gender Score: 1      CrCl 63 ml/min Platelet count 235   Per office protocol, patient can hold Eliquis for 2 days prior to procedure.    **This guidance is not considered finalized until pre-operative APP has relayed final recommendations.**

## 2023-01-14 ENCOUNTER — Telehealth: Payer: Self-pay

## 2023-01-14 NOTE — Telephone Encounter (Addendum)
Pt scheduled for tele visit on 01/27/23. Med rec and consent done

## 2023-01-14 NOTE — Telephone Encounter (Signed)
  Patient Consent for Virtual Visit         Kathy [name] has provided verbal consent on 01/14/2023 for a virtual visit (video or telephone).   CONSENT FOR VIRTUAL VISIT FOR:  Kathy [name]  By participating in this virtual visit I agree to the following:  I hereby voluntarily request, consent and authorize [facility] and its employed or contracted physicians, physician assistants, nurse practitioners or other licensed health care professionals (the Practitioner), to provide me with telemedicine health care services (the "Services") as deemed necessary by the treating Practitioner. I acknowledge and consent to receive the Services by the Practitioner via telemedicine. I understand that the telemedicine visit will involve communicating with the Practitioner through live audiovisual communication technology and the disclosure of certain medical information by electronic transmission. I acknowledge that I have been given the opportunity to request an in-person assessment or other available alternative prior to the telemedicine visit and am voluntarily participating in the telemedicine visit.  I understand that I have the right to withhold or withdraw my consent to the use of telemedicine in the course of my care at any time, without affecting my right to future care or treatment, and that the Practitioner or I may terminate the telemedicine visit at any time. I understand that I have the right to inspect all information obtained and/or recorded in the course of the telemedicine visit and may receive copies of available information for a reasonable fee.  I understand that some of the potential risks of receiving the Services via telemedicine include:  Delay or interruption in medical evaluation due to technological equipment failure or disruption; Information transmitted may not be sufficient (e.g. poor resolution of images) to allow for appropriate medical decision making by the  Practitioner; and/or  In rare instances, security protocols could fail, causing a breach of personal health information.  Furthermore, I acknowledge that it is my responsibility to provide information about my medical history, conditions and care that is complete and accurate to the best of my ability. I acknowledge that Practitioner's advice, recommendations, and/or decision may be based on factors not within their control, such as incomplete or inaccurate data provided by me or distortions of diagnostic images or specimens that may result from electronic transmissions. I understand that the practice of medicine is not an exact science and that Practitioner makes no warranties or guarantees regarding treatment outcomes. I acknowledge that a copy of this consent can be made available to me via my patient portal [facility] MyChart), or I can request a printed copy by calling the office of [facility].    I understand that my insurance will be billed for this visit.   I have read or had this consent read to me. I understand the contents of this consent, which adequately explains the benefits and risks of the Services being provided via telemedicine.  I have been provided ample opportunity to ask questions regarding this consent and the Services and have had my questions answered to my satisfaction. I give my informed consent for the services to be provided through the use of telemedicine in my medical care

## 2023-01-26 NOTE — Progress Notes (Unsigned)
Virtual Visit via Telephone Note   Because of Kathy [name]'s co-morbid illnesses, she is at least at moderate risk for complications without adequate follow up.  This format is felt to be most appropriate for this patient at this time.  The patient did not have access to video technology/had technical difficulties with video requiring transitioning to audio format only (telephone).  All issues noted in this document were discussed and addressed.  No physical exam could be performed with this format.  Please refer to the patient's chart for her consent to telehealth for [facility].  Evaluation Performed:  Preoperative cardiovascular risk assessment _____________   Date:  01/26/2023   Patient ID:  Kathy [name], DOB 06-14-1950, MRN [id number] Patient Location:  Home Provider location:   Office  Primary Care Provider:  Joycelyn Rua, MD Primary Cardiologist:  None  Chief Complaint / Patient Profile   72 y.o. y/o female with a h/o HTN, paroxysmal atrial fibrillation who is pending colonoscopy and presents today for telephonic preoperative cardiovascular risk assessment.  History of Present Illness    Kathy [name] is a 72 y.o. female who presents via audio/video conferencing for a telehealth visit today.  Pt was last seen in cardiology clinic on 09/28/2022 by Dr. Elberta Fortis.  At that time Kathy [name] was doing well .  The patient is now pending procedure as outlined above. Since her last visit, she remained stable from a cardiac standpoint.  Today she denies chest pain, shortness of breath, lower extremity edema, fatigue, palpitations, melena, hematuria, hemoptysis, diaphoresis, weakness, presyncope, syncope, orthopnea, and PND.   Past Medical History    Past Medical History:  Diagnosis Date   Cystocele    Elevated cholesterol    Fibroid    Hypertension    Kidney stone    passed stone   Osteopenia    Perimenopausal vasomotor symptoms    Rectocele    SVD  (spontaneous vaginal delivery)    x 2   Past Surgical History:  Procedure Laterality Date   ARM SURGERY  2005   ATRIAL FIBRILLATION ABLATION N/A 06/23/2022   Procedure: ATRIAL FIBRILLATION ABLATION;  Surgeon: Regan Lemming, MD;  Location: [facility] INVASIVE CV LAB;  Service: Cardiovascular;  Laterality: N/A;   AUGMENTATION MAMMAPLASTY     SALINE in 20's   BREAST SURGERY     augmentation in 20's   CARDIOVERSION N/A 01/04/2022   Procedure: CARDIOVERSION;  Surgeon: Quintella Reichert, MD;  Location: [facility] ENDOSCOPY;  Service: Cardiovascular;  Laterality: N/A;   COLONOSCOPY     RECTOCELE REPAIR N/A 10/31/2013   Procedure: POSTERIOR REPAIR (RECTOCELE);  Surgeon: Ok Edwards, MD;  Location: [facility] ORS;  Service: Gynecology;  Laterality: N/A;   right arm surgery     tendon repair   TONSILLECTOMY     TOTAL HIP ARTHROPLASTY  2001   RIGHT   TUBAL LIGATION     VAGINAL HYSTERECTOMY  1992   WISDOM TOOTH EXTRACTION      Allergies  Allergies  Allergen Reactions   Codeine Nausea And Vomiting   Prednisone Palpitations    Home Medications    Prior to Admission medications   Medication Sig Start Date End Date Taking? Authorizing Provider  apixaban (ELIQUIS) 5 MG TABS tablet Take 1 tablet (5 mg total) by mouth 2 (two) times daily. 07/21/22   Fenton, Clint R, PA  apixaban (ELIQUIS) 5 MG TABS tablet Take 1 tablet (5 mg total) by mouth 2 (two) times daily. 09/28/22  Camnitz, Will Daphine Deutscher, MD  Calcium Carbonate-Vitamin D (CALCIUM + D PO) Take 600 mg by mouth 2 (two) times daily.    [provider]  chlorthalidone (HYGROTON) 25 MG tablet Take 12.5 mg by mouth daily. 03/21/20   [provider]  Cholecalciferol (VITAMIN D-3) 125 MCG (5000 UT) TABS Take 5,000 Units by mouth daily.    [provider]  Coenzyme Q10 (COQ10) 200 MG CAPS Take 200 mg by mouth daily.    [provider]  diazepam (VALIUM) 10 MG tablet TAKE 1 TABLET BY MOUTH EVERY 6 HOURS AS NEEDED FOR ANXIETY 09/14/18    Genia Del, MD  metoprolol succinate (TOPROL-XL) 25 MG 24 hr tablet TAKE 1 TABLET BY MOUTH DAILY 09/21/21   Fenton, Clint R, PA  potassium chloride (KLOR-CON) 10 MEQ tablet TAKE 1 TABLET(10 MEQ) BY MOUTH DAILY 11/30/22   Fenton, Clint R, PA  rosuvastatin (CRESTOR) 20 MG tablet Take 20 mg by mouth at bedtime. 09/30/21   [provider]  sertraline (ZOLOFT) 100 MG tablet Take 100 mg by mouth daily. 02/26/19   [provider]    Physical Exam    Vital Signs:  Kathy [name] does not have vital signs available for review today.  Given telephonic nature of communication, physical exam is limited. AAOx3. NAD. Normal affect.  Speech and respirations are unlabored.  Accessory Clinical Findings    None  Assessment & Plan    1.  Preoperative Cardiovascular Risk Assessment: Colonoscopy,Dr. Kerin Salen  Surgeon's Group or Practice Name:  [facility] Phone number:  [phone] Fax number:  [phone]    Primary Cardiologist: Dr. Elberta Fortis  Chart reviewed as part of pre-operative protocol coverage. Given past medical history and time since last visit, based on ACC/AHA guidelines, Kathy [name] would be at acceptable risk for the planned procedure without further cardiovascular testing.   Patient was advised that if he develops new symptoms prior to surgery to contact our office to arrange a follow-up appointment.  He verbalized understanding.  Patient with diagnosis of afib on Eliquis for anticoagulation.     Procedure: colonoscopy Date of procedure: 02/09/23     CHA2DS2-VASc Score = 5   This indicates a 7.2% annual risk of stroke. The patient's score is based upon: CHF History: 1 HTN History: 1 Diabetes History: 0 Stroke History: 0 Vascular Disease History: 1 Age Score: 1 Gender Score: 1       CrCl 63 ml/min Platelet count 235     Per office protocol, patient can hold Eliquis for 2 days prior to procedure.   I will route this recommendation to the  requesting party via Epic fax function and remove from pre-op pool.       Time:   Today, I have spent 5 minutes with the patient with telehealth technology discussing medical history, symptoms, and management plan.     Ronney Asters, NP  01/26/2023, 8:40 AM    Prior to patient's phone evaluation I spent greater than 10 minutes reviewing their past medical history and cardiac medications.

## 2023-01-27 ENCOUNTER — Ambulatory Visit: Payer: Medicare Other | Attending: Internal Medicine

## 2023-01-27 DIAGNOSIS — Z01810 Encounter for preprocedural cardiovascular examination: Secondary | ICD-10-CM | POA: Diagnosis not present

## 2023-02-09 DIAGNOSIS — Z860101 Personal history of adenomatous and serrated colon polyps: Secondary | ICD-10-CM | POA: Diagnosis not present

## 2023-02-09 DIAGNOSIS — Z09 Encounter for follow-up examination after completed treatment for conditions other than malignant neoplasm: Secondary | ICD-10-CM | POA: Diagnosis not present

## 2023-02-09 DIAGNOSIS — D123 Benign neoplasm of transverse colon: Secondary | ICD-10-CM | POA: Diagnosis not present

## 2023-02-09 DIAGNOSIS — D175 Benign lipomatous neoplasm of intra-abdominal organs: Secondary | ICD-10-CM | POA: Diagnosis not present

## 2023-02-09 DIAGNOSIS — K648 Other hemorrhoids: Secondary | ICD-10-CM | POA: Diagnosis not present

## 2023-02-09 DIAGNOSIS — K5730 Diverticulosis of large intestine without perforation or abscess without bleeding: Secondary | ICD-10-CM | POA: Diagnosis not present

## 2023-02-14 DIAGNOSIS — D123 Benign neoplasm of transverse colon: Secondary | ICD-10-CM | POA: Diagnosis not present

## 2023-03-16 DIAGNOSIS — R5383 Other fatigue: Secondary | ICD-10-CM | POA: Diagnosis not present

## 2023-03-16 DIAGNOSIS — R051 Acute cough: Secondary | ICD-10-CM | POA: Diagnosis not present

## 2023-03-16 DIAGNOSIS — J22 Unspecified acute lower respiratory infection: Secondary | ICD-10-CM | POA: Diagnosis not present

## 2023-03-23 DIAGNOSIS — E782 Mixed hyperlipidemia: Secondary | ICD-10-CM | POA: Diagnosis not present

## 2023-03-23 DIAGNOSIS — I480 Paroxysmal atrial fibrillation: Secondary | ICD-10-CM | POA: Diagnosis not present

## 2023-03-23 DIAGNOSIS — Z9181 History of falling: Secondary | ICD-10-CM | POA: Diagnosis not present

## 2023-03-23 DIAGNOSIS — I10 Essential (primary) hypertension: Secondary | ICD-10-CM | POA: Diagnosis not present

## 2023-03-23 DIAGNOSIS — D6869 Other thrombophilia: Secondary | ICD-10-CM | POA: Diagnosis not present

## 2023-03-23 DIAGNOSIS — I5020 Unspecified systolic (congestive) heart failure: Secondary | ICD-10-CM | POA: Diagnosis not present

## 2023-03-23 DIAGNOSIS — R5383 Other fatigue: Secondary | ICD-10-CM | POA: Diagnosis not present

## 2023-03-25 DIAGNOSIS — R918 Other nonspecific abnormal finding of lung field: Secondary | ICD-10-CM | POA: Diagnosis not present

## 2023-03-25 DIAGNOSIS — R531 Weakness: Secondary | ICD-10-CM | POA: Diagnosis not present

## 2023-03-25 DIAGNOSIS — H748X2 Other specified disorders of left middle ear and mastoid: Secondary | ICD-10-CM | POA: Diagnosis not present

## 2023-03-25 DIAGNOSIS — F17210 Nicotine dependence, cigarettes, uncomplicated: Secondary | ICD-10-CM | POA: Diagnosis not present

## 2023-03-25 DIAGNOSIS — M47812 Spondylosis without myelopathy or radiculopathy, cervical region: Secondary | ICD-10-CM | POA: Diagnosis not present

## 2023-03-25 DIAGNOSIS — M50322 Other cervical disc degeneration at C5-C6 level: Secondary | ICD-10-CM | POA: Diagnosis not present

## 2023-03-25 DIAGNOSIS — H60502 Unspecified acute noninfective otitis externa, left ear: Secondary | ICD-10-CM | POA: Diagnosis not present

## 2023-03-25 DIAGNOSIS — R519 Headache, unspecified: Secondary | ICD-10-CM | POA: Diagnosis not present

## 2023-03-25 DIAGNOSIS — M542 Cervicalgia: Secondary | ICD-10-CM | POA: Diagnosis not present

## 2023-03-25 DIAGNOSIS — H6122 Impacted cerumen, left ear: Secondary | ICD-10-CM | POA: Diagnosis not present

## 2023-03-25 DIAGNOSIS — M50323 Other cervical disc degeneration at C6-C7 level: Secondary | ICD-10-CM | POA: Diagnosis not present

## 2023-03-25 DIAGNOSIS — H6123 Impacted cerumen, bilateral: Secondary | ICD-10-CM | POA: Diagnosis not present

## 2023-03-25 DIAGNOSIS — Z885 Allergy status to narcotic agent status: Secondary | ICD-10-CM | POA: Diagnosis not present

## 2023-04-04 ENCOUNTER — Other Ambulatory Visit (HOSPITAL_COMMUNITY): Payer: Self-pay | Admitting: *Deleted

## 2023-04-04 MED ORDER — APIXABAN 5 MG PO TABS: 5.0000 mg | ORAL_TABLET | Freq: Two times a day (BID) | ORAL | 1 refills | Status: DC

## 2023-04-05 ENCOUNTER — Ambulatory Visit (HOSPITAL_COMMUNITY): Payer: Medicare Other | Admitting: Physician Assistant

## 2023-04-07 IMAGING — MG DIGITAL SCREENING BREAST BILAT IMPLANT W/ TOMO W/ CAD
8 of 12 series · 8 of 28 positions shown · non-contrast
Comparison: Prior films

CLINICAL DATA: Screening.

EXAM:
DIGITAL SCREENING BILATERAL MAMMOGRAM WITH IMPLANTS, CAD AND
TOMOSYNTHESIS
TECHNIQUE: Bilateral screening digital craniocaudal and mediolateral oblique
mammograms were obtained. Bilateral screening digital breast
tomosynthesis was performed. The images were evaluated with
computer-aided detection. Standard and/or implant displaced views
were performed.

[R CC]
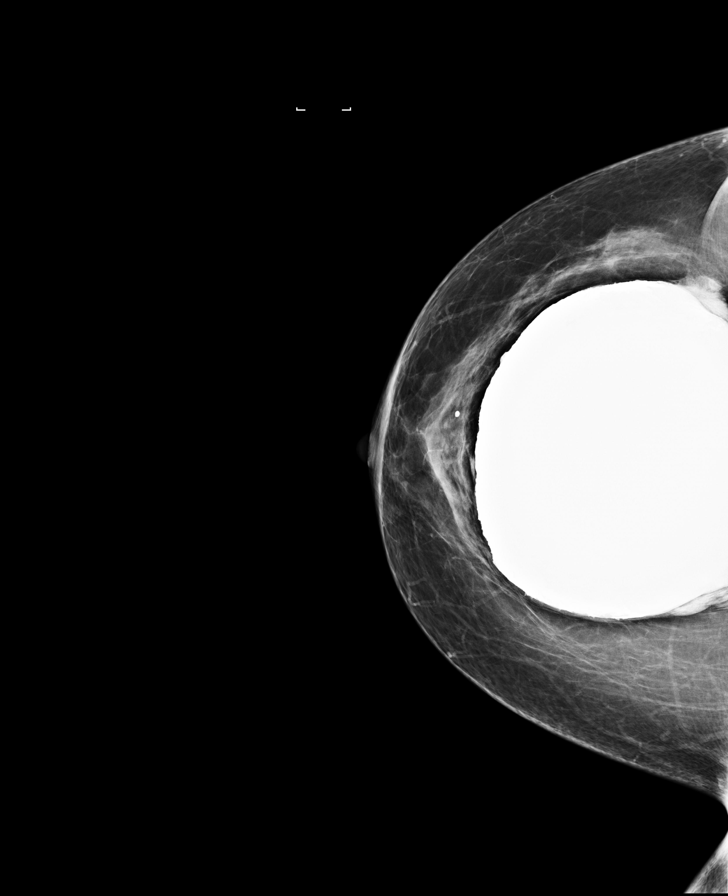

[R MLO]
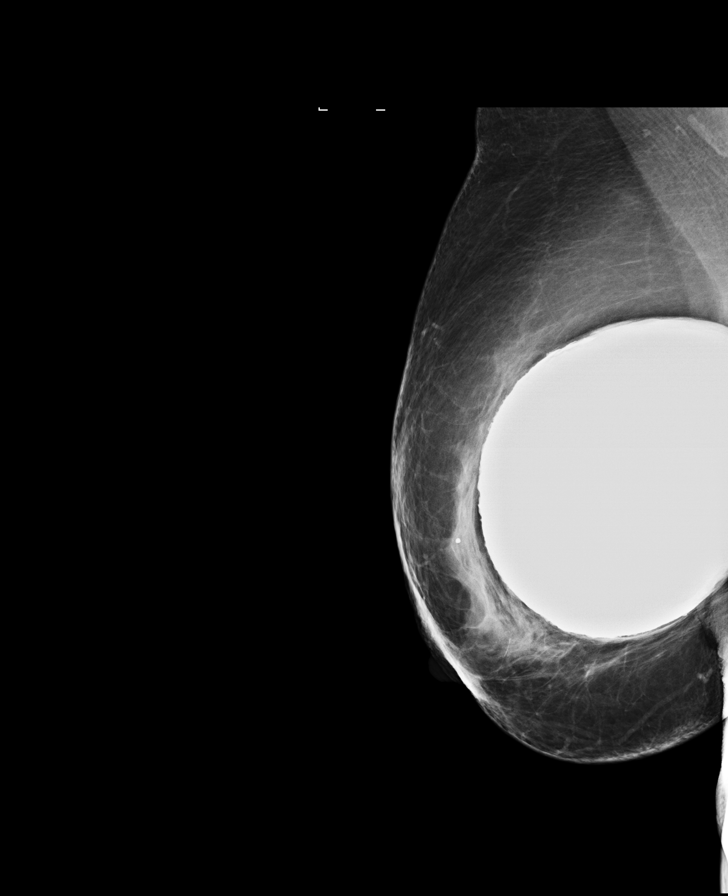

[L CC]
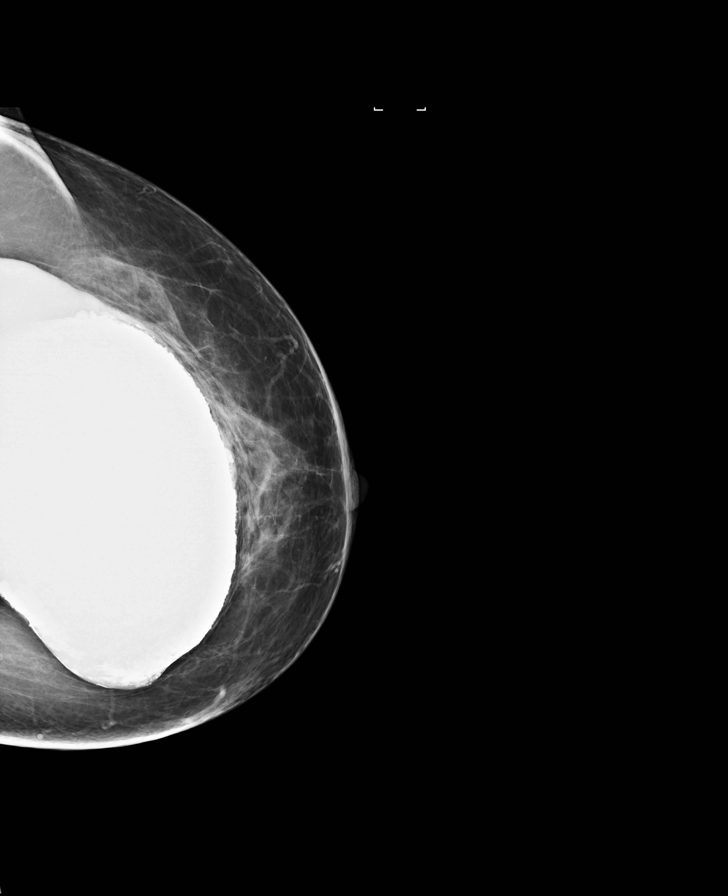

[L MLO]
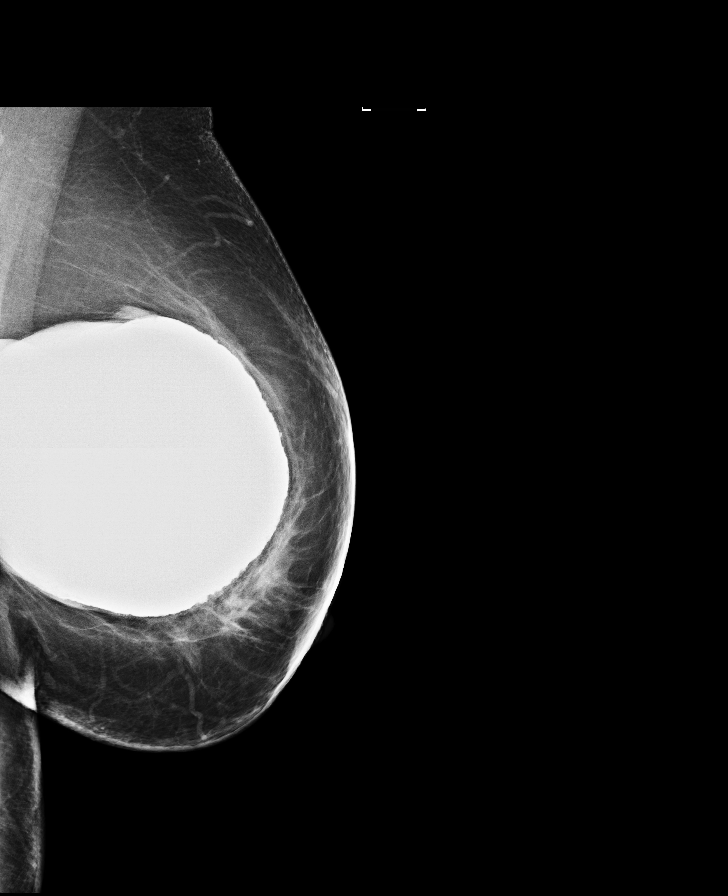

[R MLO synth-2D]
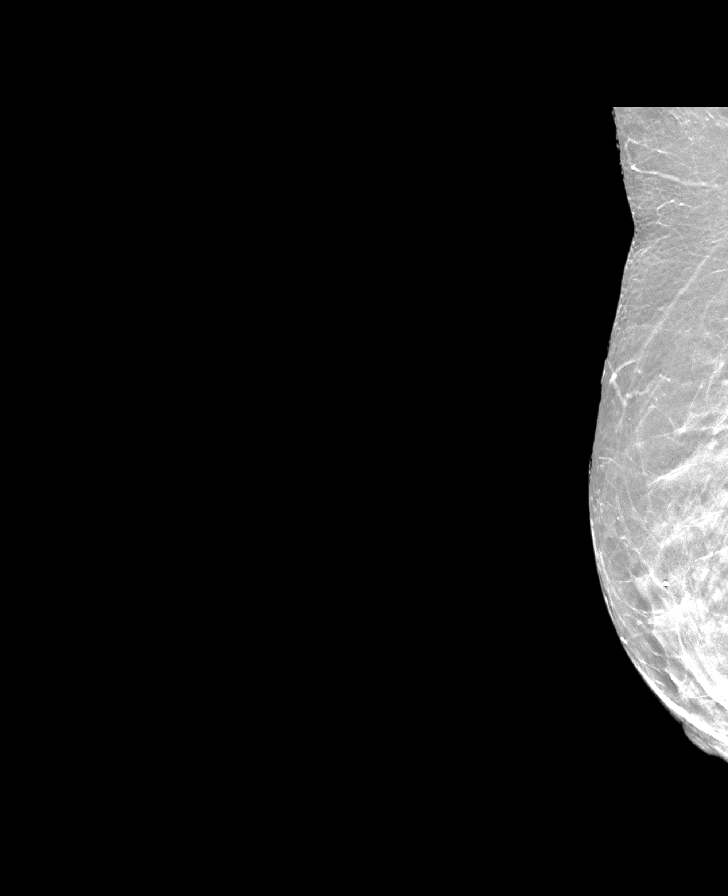

[L MLO synth-2D]
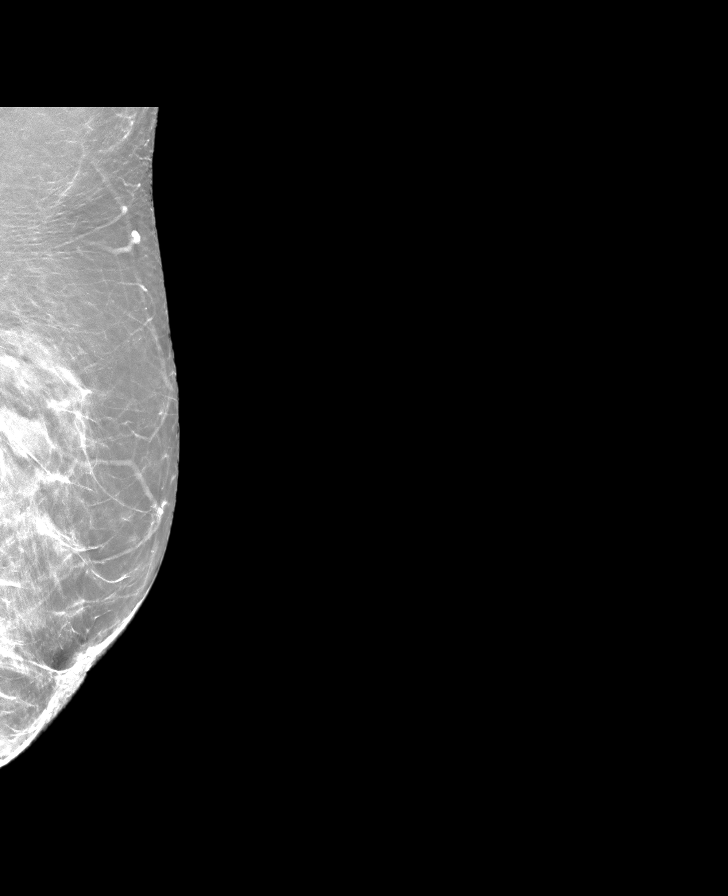

[R CC synth-2D]
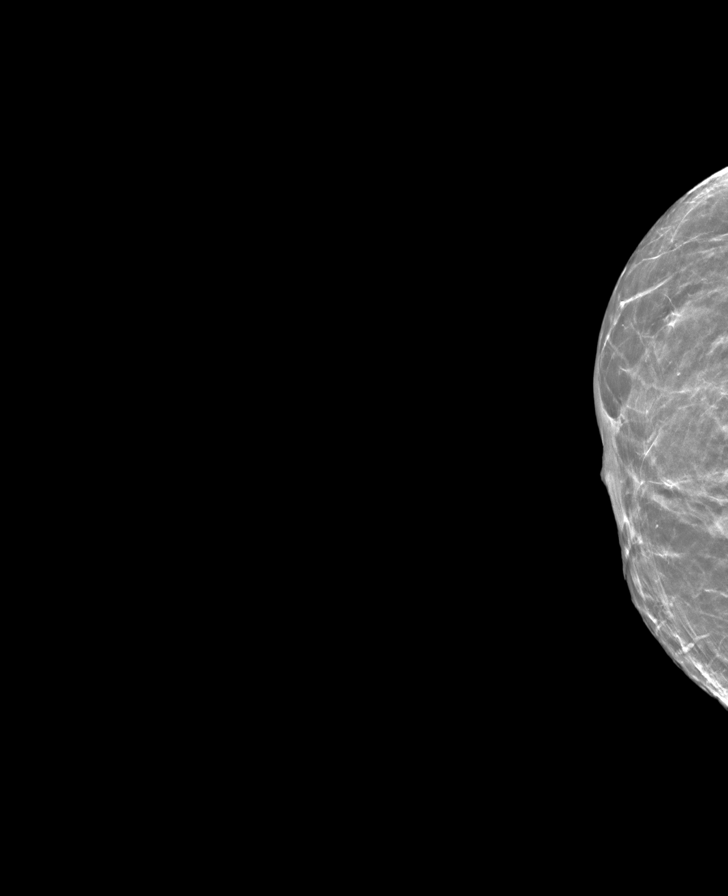

[L CC synth-2D]
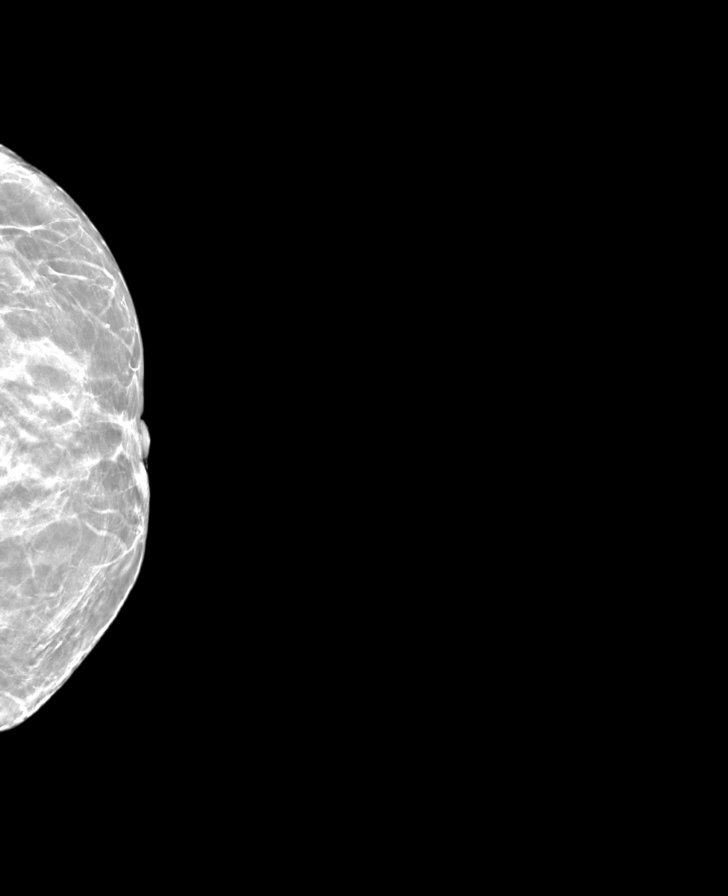

[8 of 28 positions shown; findings below may reference images not displayed]

ACR Breast Density Category b: There are scattered areas of
fibroglandular density.
FINDINGS: The patient has implants. There are no findings suspicious for
malignancy.
IMPRESSION: No mammographic evidence of malignancy. A result letter of this
screening mammogram will be mailed directly to the patient.

RECOMMENDATION:
Screening mammogram in one year. (Code:[id number])

BI-RADS CATEGORY  1:  Negative.

## 2023-04-13 ENCOUNTER — Ambulatory Visit (HOSPITAL_COMMUNITY): Payer: Self-pay | Admitting: Physician Assistant

## 2023-04-25 DIAGNOSIS — S00412A Abrasion of left ear, initial encounter: Secondary | ICD-10-CM | POA: Diagnosis not present

## 2023-04-25 DIAGNOSIS — H6993 Unspecified Eustachian tube disorder, bilateral: Secondary | ICD-10-CM | POA: Diagnosis not present

## 2023-05-05 ENCOUNTER — Ambulatory Visit (HOSPITAL_COMMUNITY)
Admission: RE | Admit: 2023-05-05 | Discharge: 2023-05-05 | Disposition: A | Discharge status: Home / Self Care | Payer: Self-pay | Source: Ambulatory Visit | Attending: Physician Assistant | Admitting: Physician Assistant

## 2023-05-05 ENCOUNTER — Encounter (HOSPITAL_COMMUNITY): Payer: Self-pay | Admitting: Physician Assistant

## 2023-05-05 VITALS — BP 110/70 | HR 78 | Ht 61.0 in | Wt 167.0 lb

## 2023-05-05 DIAGNOSIS — Z79899 Other long term (current) drug therapy: Secondary | ICD-10-CM | POA: Diagnosis not present

## 2023-05-05 DIAGNOSIS — I110 Hypertensive heart disease with heart failure: Secondary | ICD-10-CM | POA: Insufficient documentation

## 2023-05-05 DIAGNOSIS — I5022 Chronic systolic (congestive) heart failure: Secondary | ICD-10-CM | POA: Diagnosis not present

## 2023-05-05 DIAGNOSIS — D6869 Other thrombophilia: Secondary | ICD-10-CM | POA: Diagnosis not present

## 2023-05-05 DIAGNOSIS — I4819 Other persistent atrial fibrillation: Secondary | ICD-10-CM

## 2023-05-05 DIAGNOSIS — I2510 Atherosclerotic heart disease of native coronary artery without angina pectoris: Secondary | ICD-10-CM | POA: Insufficient documentation

## 2023-05-05 DIAGNOSIS — Z7901 Long term (current) use of anticoagulants: Secondary | ICD-10-CM | POA: Diagnosis not present

## 2023-05-05 MED ORDER — APIXABAN 5 MG PO TABS: 5.0000 mg | ORAL_TABLET | Freq: Two times a day (BID) | ORAL | 1 refills | Status: DC

## 2023-05-05 NOTE — Progress Notes (Signed)
 Primary Care Physician: Joycelyn Rua, MD Primary Cardiologist: none Primary Electrophysiologist: Dr Elberta Fortis Referring Physician: Dr Johney Frame   Kathy [name] is a 73 y.o. female with a history of HTN, tobacco abuse, and atrial fibrillation who presents for follow up in the [facility].  The patient was initially diagnosed with atrial fibrillation on 08/18/18 after presenting with symptoms of palpitations and wearing a 3 day heart monitor which showed 3% AF burden. Patient reports that these palpitations had been going on for about one month prior to her diagnosis, about 2 episodes per week. She denies any specific triggers. She denies snoring or significant alcohol use. She is on Eliquis for a CHADS2VASC score of 3.  Patient presented for oral surgery today (have implant placed) and she was found to be in afib with mildly elevated heart rates. She was given labetalol at the dentist office and recommended to follow up here. She is unaware of her arrhythmia but did have some intermittent chest pain. Myoview did not show any ischemia but did show reduced EF. Echo also showed EF 40-45%, no focal wall motion abnormalities. Zio monitor showed 100% afib burden with average HR 108 bpm.   Patient is s/p DCCV on 01/04/22. She was back in afib when she saw Dr Elberta Fortis on 02/02/22 and was started on amiodarone as a bridge to ablation. She was set up for another DCCV but chemically converted prior. She is s/p afib ablation 06/23/22. Amiodarone discontinued post ablation.   Patient returns for follow up for atrial fibrillation. Patient reports that she has done well since her last visit. She has rare, brief palpitations that only last for a few minutes. No bleeding issues on anticoagulation. Her family members have told her that she is having some memory issues. She plans to follow up with her PCP for this.   Today, he denies symptoms of chest pain, shortness of breath, orthopnea,  PND, lower extremity edema, dizziness, presyncope, syncope, snoring, daytime somnolence, bleeding. The patient is tolerating medications without difficulties and is otherwise without complaint today.    Atrial Fibrillation Risk Factors:  she does not have symptoms or diagnosis of sleep apnea. she does not have a history of rheumatic fever. she does not have a history of alcohol use. The patient does not have a history of early familial atrial fibrillation or other arrhythmias.   Atrial Fibrillation Management history:  Previous antiarrhythmic drugs: amiodarone  Previous cardioversions: 01/04/22 Previous ablations: 06/23/22 Anticoagulation history: Eliquis   Past Medical History:  Diagnosis Date   Cystocele    Elevated cholesterol    Fibroid    Hypertension    Kidney stone    passed stone   Osteopenia    Perimenopausal vasomotor symptoms    Rectocele    SVD (spontaneous vaginal delivery)    x 2    Current Outpatient Medications  Medication Sig Dispense Refill   Calcium Carbonate-Vitamin D (CALCIUM + D PO) Take 600 mg by mouth 2 (two) times daily.     chlorthalidone (HYGROTON) 25 MG tablet Take 12.5 mg by mouth daily.     Cholecalciferol (VITAMIN D-3) 125 MCG (5000 UT) TABS Take 5,000 Units by mouth daily.     Coenzyme Q10 (COQ10) 200 MG CAPS Take 200 mg by mouth daily.     diazepam (VALIUM) 10 MG tablet TAKE 1 TABLET BY MOUTH EVERY 6 HOURS AS NEEDED FOR ANXIETY 30 tablet 2   metoprolol succinate (TOPROL-XL) 25 MG 24 hr tablet TAKE  1 TABLET BY MOUTH DAILY 180 tablet 3   potassium chloride (KLOR-CON) 10 MEQ tablet TAKE 1 TABLET(10 MEQ) BY MOUTH DAILY 90 tablet 1   rosuvastatin (CRESTOR) 20 MG tablet Take 20 mg by mouth at bedtime.     sertraline (ZOLOFT) 100 MG tablet Take 100 mg by mouth daily.     apixaban (ELIQUIS) 5 MG TABS tablet Take 1 tablet (5 mg total) by mouth 2 (two) times daily. 180 tablet 1   No current facility-administered medications for this encounter.     ROS- All systems are reviewed and negative except as per the HPI above.  Physical Exam: Vitals:   05/05/23 1027  BP: 110/70  Pulse: 78  Weight: 75.8 kg  Height: 5\' 1"  (1.549 m)     GEN: Well nourished, well developed in no acute distress CARDIAC: Regular rate and rhythm, no murmurs, rubs, gallops RESPIRATORY:  Clear to auscultation without rales, wheezing or rhonchi  ABDOMEN: Soft, non-tender, non-distended EXTREMITIES:  No edema; No deformity    Wt Readings from Last 3 Encounters:  05/05/23 75.8 kg  07/21/22 76.2 kg  06/23/22 65.8 kg    EKG today demonstrates  SR, LAFB, PAC, 1st degree AV block Vent. rate 78 BPM PR interval 206 ms QRS duration 86 ms QT/QTcB 404/460 ms   Echo 12/17/21  1. Left ventricular ejection fraction, by estimation, is 40 to 45%. Left  ventricular ejection fraction by 2D MOD biplane is 41.3 %. The left  ventricle has mildly decreased function. The left ventricle demonstrates  global hypokinesis. Left ventricular diastolic function could not be evaluated.   2. Right ventricular systolic function is mildly reduced. The right  ventricular size is mildly enlarged. Tricuspid regurgitation signal is  inadequate for assessing PA pressure.   3. The mitral valve is grossly normal. Trivial mitral valve  regurgitation. No evidence of mitral stenosis.   4. The aortic valve was not well visualized. Aortic valve regurgitation  is not visualized. No aortic stenosis is present.   5. The inferior vena cava is dilated in size with >50% respiratory  variability, suggesting right atrial pressure of 8 mmHg.   Comparison(s): Changes from prior study are noted. The left ventricular  function is worsened.    Epic records are reviewed at length today  CHA2DS2-VASc Score = 5  The patient's score is based upon: CHF History: 1 HTN History: 1 Diabetes History: 0 Stroke History: 0 Vascular Disease History: 1 Age Score: 1 Gender Score: 1        ASSESSMENT AND PLAN: Persistent Atrial Fibrillation (ICD10:  I48.19) The patient's CHA2DS2-VASc score is 5, indicating a 7.2% annual risk of stroke.   S/p afib ablation 06/23/22, off amiodarone  Patient appears to be maintaining SR Continue Toprol 25 mg daily Continue Eliquis 5 mg BID  Secondary Hypercoagulable State (ICD10:  D68.69) The patient is at significant risk for stroke/thromboembolism based upon her CHA2DS2-VASc Score of 5.  Continue Apixaban (Eliquis). No bleeding issues.   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     Jorja Loa PA-C [facility] [address] [place], [place] [address] [phone] 05/05/2023 10:50 AM

## 2023-05-06 ENCOUNTER — Other Ambulatory Visit (HOSPITAL_COMMUNITY): Payer: Self-pay

## 2023-05-06 MED ORDER — APIXABAN 5 MG PO TABS: 5.0000 mg | ORAL_TABLET | Freq: Two times a day (BID) | ORAL | Status: DC

## 2023-05-29 ENCOUNTER — Other Ambulatory Visit (HOSPITAL_COMMUNITY): Payer: Self-pay | Admitting: Physician Assistant

## 2023-07-28 ENCOUNTER — Other Ambulatory Visit (HOSPITAL_BASED_OUTPATIENT_CLINIC_OR_DEPARTMENT_OTHER): Payer: Self-pay | Admitting: Family Medicine

## 2023-07-28 DIAGNOSIS — Z1231 Encounter for screening mammogram for malignant neoplasm of breast: Secondary | ICD-10-CM

## 2023-08-01 ENCOUNTER — Telehealth (HOSPITAL_COMMUNITY): Payer: Self-pay | Admitting: *Deleted

## 2023-08-01 DIAGNOSIS — R000 Tachycardia, unspecified: Secondary | ICD-10-CM | POA: Diagnosis not present

## 2023-08-01 DIAGNOSIS — H6993 Unspecified Eustachian tube disorder, bilateral: Secondary | ICD-10-CM | POA: Diagnosis not present

## 2023-08-01 DIAGNOSIS — H906 Mixed conductive and sensorineural hearing loss, bilateral: Secondary | ICD-10-CM | POA: Diagnosis not present

## 2023-08-01 NOTE — Telephone Encounter (Signed)
 Patient called in stating she went to physician office this  morning and was told her HR was running in the 140s. BP 96/70.  Once patient was home she rechecked her vitals. BP 131/77 HR 136. Pt overall feels ok did not realize she was back in afib.  Discussed with Myrtha Ates PA will take an extra 25mg  of metoprolol  tonight and follow up scheduled for tomorrow. Pt confirmed no missed doses of Eliquis .

## 2023-08-02 ENCOUNTER — Ambulatory Visit (HOSPITAL_COMMUNITY)
Admission: RE | Admit: 2023-08-02 | Discharge: 2023-08-02 | Disposition: A | Discharge status: Home / Self Care | Source: Ambulatory Visit | Attending: Physician Assistant | Admitting: Physician Assistant

## 2023-08-02 ENCOUNTER — Other Ambulatory Visit (HOSPITAL_COMMUNITY): Payer: Self-pay | Admitting: *Deleted

## 2023-08-02 ENCOUNTER — Encounter (HOSPITAL_COMMUNITY): Payer: Self-pay | Admitting: Physician Assistant

## 2023-08-02 VITALS — BP 116/90 | HR 135 | Ht 61.0 in | Wt 170.4 lb

## 2023-08-02 DIAGNOSIS — I10 Essential (primary) hypertension: Secondary | ICD-10-CM | POA: Diagnosis not present

## 2023-08-02 DIAGNOSIS — I471 Supraventricular tachycardia, unspecified: Secondary | ICD-10-CM | POA: Diagnosis not present

## 2023-08-02 DIAGNOSIS — I4819 Other persistent atrial fibrillation: Secondary | ICD-10-CM

## 2023-08-02 DIAGNOSIS — D6869 Other thrombophilia: Secondary | ICD-10-CM

## 2023-08-02 MED ORDER — METOPROLOL SUCCINATE ER 25 MG PO TB24: 50.0000 mg | ORAL_TABLET | Freq: Two times a day (BID) | ORAL | 3 refills | Status: AC

## 2023-08-02 MED ORDER — APIXABAN 5 MG PO TABS: 5.0000 mg | ORAL_TABLET | Freq: Two times a day (BID) | ORAL | Status: DC

## 2023-08-02 NOTE — Patient Instructions (Signed)
 Hold Chlorthalidone for now   Increase metoprolol  to 50mg  twice a day     Supraventricular Tachycardia, Adult Supraventricular tachycardia (SVT) is a kind of abnormal heartbeat. It makes your heart beat very fast. This may last for just a short time. Or it may last longer and need treatment to get the heartbeat to go back to normal. A normal resting heartbeat is 60-100 times a minute. SVT can make your heart beat more than 150 times a minute.  The times when you have a fast heartbeat--or episodes of SVT--can be scary. But they're usually not dangerous. In some cases, SVT can lead to other heart problems. What are the causes?  SVT happens when electrical signals are sent out from areas of the upper part of the heart that don't normally send heartbeat signals. What increases the risk? You are more likely to get SVT if you are: Middle aged or older. Female. Other things that may increase your risk include: Having stress or feeling worried or nervous. Using illegal drugs such as cocaine or methamphetamine. Using over-the-counter cough or cold medicines. Stimulant drugs, such as caffeine. Smoking or alcohol use. Having any of these conditions: A thyroid  condition. Diabetes. Obstructive sleep apnea. What are the signs or symptoms? A pounding heartbeat. Feeling fast or irregular heartbeats called palpitations. Shortness of breath. Weakness and tiredness. Tightness or pain in your chest. Feeling light-headed or dizzy. Feeling worried or nervous. Sometimes there are no symptoms. How is this diagnosed? This condition may be diagnosed based on: Your symptoms and a physical exam. Your health care provider will listen to your heart and feel your pulse. Tests. These may include: An electrocardiogram (ECG). This test checks for problems with electrical activity in the heart. A Holter monitor or event monitor test. For this test, you'll wear a device that monitors your heart rate over  time. An echocardiogram. This test uses sound waves to make a picture of your heart. A stress echocardiogram. An echocardiogram is done when you are at rest and after exercise. Blood tests. An electrophysiology study (EPS). This tests the electrical activity in your heart. It helps find where the abnormal heart rhythm is coming from. How is this treated? Treatment may include: Vagal nerve stimulation. Doing things to stimulate a nerve called the vagus nerve can slow down the heartbeat. Work with your provider to find which technique works best for you. Ways to do this include: Holding your breath and pushing, as though you are pooping. Putting gentle pressure on the neck. Do not try this yourself. Only a provider should do this. If done the wrong way, it can lead to a stroke. Medicines that prevent attacks. Medicine to stop an attack. This is given through an IV at the hospital. Cardioversion. This uses a small electric shock to stop an attack. Catheter ablation. This is a procedure to get rid of cells in the area that's causing the fast heartbeats. If you don't have symptoms, you may not need treatment. Follow these instructions at home: Stress Avoid things that make you feel stressed. Find healthy ways to deal with stress, such as: Doing yoga or meditation. Going out in nature. Listening to relaxing music. Taking steps to be healthy, such as getting lots of sleep, exercising, and eating a balanced diet. Talking with a mental health provider. Lifestyle Try to get at least 7 hours of sleep each night. Do not smoke, vape, or use products with nicotine or tobacco in them. If you need help quitting, talk with  your provider. Do not use illegal drugs. If you need help quitting, talk with your provider. Do not drink alcohol if it gives you a fast heartbeat. If alcohol does not seem to give you a fast heartbeat, limit your alcohol use. If you drink alcohol: Limit how much you have to: 0-1  drink a day if you are female. 0-2 drinks a day if you are female. Know how much alcohol is in your drink. In the [place] one drink is one 12 oz bottle of beer (355 mL), one 5 oz glass of wine (148 mL), or one 1 oz glass of hard liquor (44 mL). Be aware of how caffeine affects you. If caffeine gives you a fast heartbeat, do not eat, drink, or use anything with caffeine in it. If caffeine doesn't seem to give you a fast heartbeat, limit how much caffeine you have. General instructions Stay at a healthy weight. Exercise often. Ask your provider about good activities for you. Try one or a mixture of these: 150 minutes a week of gentle exercise, like walking or yoga. 75 minutes a week of exercise that is very active, like running or swimming. Do vagal nerve stimulation only as told by your provider. Take medicines only as told by your provider. Keep all follow-up visits. Your provider will want to make sure your treatments are working. Contact a health care provider if: You have a fast heartbeat more often. The feeling that your heart is beating fast lasts longer than before. Home treatments to slow down your heartbeat do not help. You have new symptoms. Get help right away if: You have chest pain. Your heart beats very fast for more than 20 minutes. You have trouble breathing. You faint. Your symptoms get worse. These symptoms may be an emergency. Call 911 right away. Do not wait to see if the symptoms will go away. Do not drive yourself to the hospital. This information is not intended to replace advice given to you by your health care provider. Make sure you discuss any questions you have with your health care provider. Document Revised: 11/11/2022 Document Reviewed: 03/30/2022 Elsevier Patient Education  2024 [employer].

## 2023-08-02 NOTE — Progress Notes (Signed)
 Primary Care Physician: Wyn Heater, MD Primary Cardiologist: none Primary Electrophysiologist: Dr Lawana Pray Referring Physician: Dr Nunzio Belch   Kathy [name] is a 73 y.o. female with a history of HTN, tobacco abuse, and atrial fibrillation who presents for follow up in the [facility].  The patient was initially diagnosed with atrial fibrillation on 08/18/18 after presenting with symptoms of palpitations and wearing a 3 day heart monitor which showed 3% AF burden. Patient reports that these palpitations had been going on for about one month prior to her diagnosis, about 2 episodes per week. She denies any specific triggers. She denies snoring or significant alcohol use. She is on Eliquis  for a CHADS2VASC score of 3.  Patient presented for oral surgery today (have implant placed) and she was found to be in afib with mildly elevated heart rates. She was given labetalol at the dentist office and recommended to follow up here. She is unaware of her arrhythmia but did have some intermittent chest pain. Myoview  did not show any ischemia but did show reduced EF. Echo also showed EF 40-45%, no focal wall motion abnormalities. Zio monitor showed 100% afib burden with average HR 108 bpm.   Patient is s/p DCCV on 01/04/22. She was back in afib when she saw Dr Lawana Pray on 02/02/22 and was started on amiodarone  as a bridge to ablation. She was set up for another DCCV but chemically converted prior. She is s/p afib ablation 06/23/22. Amiodarone  discontinued post ablation.   Patient returns for follow up for atrial fibrillation. She was seen at another physician office on 08/01/23 and found to be tachycardic with rates in the 140's. She called our clinic and was instructed to take an extra dose of BB. She has noticed intermittent tachypalpitations and some fatigue. The symptoms first started about one month ago.  Today, she  denies symptoms of chest pain, shortness of breath, orthopnea,  PND, lower extremity edema, dizziness, presyncope, syncope, snoring, daytime somnolence, bleeding, or neurologic sequela. The patient is tolerating medications without difficulties and is otherwise without complaint today.    Atrial Fibrillation Risk Factors:  she does not have symptoms or diagnosis of sleep apnea. she does not have a history of rheumatic fever. she does not have a history of alcohol use. The patient does not have a history of early familial atrial fibrillation or other arrhythmias.   Atrial Fibrillation Management history:  Previous antiarrhythmic drugs: amiodarone   Previous cardioversions: 01/04/22 Previous ablations: 06/23/22 Anticoagulation history: Eliquis    Past Medical History:  Diagnosis Date   Cystocele    Elevated cholesterol    Fibroid    Hypertension    Kidney stone    passed stone   Osteopenia    Perimenopausal vasomotor symptoms    Rectocele    SVD (spontaneous vaginal delivery)    x 2    Current Outpatient Medications  Medication Sig Dispense Refill   apixaban  (ELIQUIS ) 5 MG TABS tablet Take 1 tablet (5 mg total) by mouth 2 (two) times daily. 14 tablet    Calcium Carbonate-Vitamin D  (CALCIUM + D PO) Take 600 mg by mouth 2 (two) times daily.     chlorthalidone (HYGROTON) 25 MG tablet Take 12.5 mg by mouth daily.     Cholecalciferol (VITAMIN D -3) 125 MCG (5000 UT) TABS Take 5,000 Units by mouth daily.     Coenzyme Q10 (COQ10) 200 MG CAPS Take 200 mg by mouth daily.     diazepam  (VALIUM ) 10 MG tablet TAKE 1  TABLET BY MOUTH EVERY 6 HOURS AS NEEDED FOR ANXIETY 30 tablet 2   potassium chloride  (KLOR-CON ) 10 MEQ tablet TAKE 1 TABLET(10 MEQ) BY MOUTH DAILY 90 tablet 1   rosuvastatin (CRESTOR) 20 MG tablet Take 20 mg by mouth at bedtime.     sertraline (ZOLOFT) 100 MG tablet Take 100 mg by mouth daily.     metoprolol  succinate (TOPROL -XL) 25 MG 24 hr tablet Take 2 tablets (50 mg total) by mouth 2 (two) times daily. 120 tablet 3   No current  facility-administered medications for this encounter.    ROS- All systems are reviewed and negative except as per the HPI above.  Physical Exam: Vitals:   08/02/23 1030  BP: (!) 116/90  Pulse: (!) 135  Weight: 77.3 kg  Height: 5\' 1"  (1.549 m)     GEN: Well nourished, well developed in no acute distress NECK: No JVD; No carotid bruits CARDIAC: Regular rate and rhythm, tachycardia, no murmurs, rubs, gallops RESPIRATORY:  Clear to auscultation without rales, wheezing or rhonchi  ABDOMEN: Soft, non-tender, non-distended EXTREMITIES:  No edema; No deformity    Wt Readings from Last 3 Encounters:  08/02/23 77.3 kg  05/05/23 75.8 kg  07/21/22 76.2 kg    EKG today demonstrates  SVT Vent. rate 135 BPM PR interval * ms QRS duration 86 ms QT/QTcB 336/504 ms   Echo 12/17/21  1. Left ventricular ejection fraction, by estimation, is 40 to 45%. Left  ventricular ejection fraction by 2D MOD biplane is 41.3 %. The left  ventricle has mildly decreased function. The left ventricle demonstrates  global hypokinesis. Left ventricular diastolic function could not be evaluated.   2. Right ventricular systolic function is mildly reduced. The right  ventricular size is mildly enlarged. Tricuspid regurgitation signal is  inadequate for assessing PA pressure.   3. The mitral valve is grossly normal. Trivial mitral valve  regurgitation. No evidence of mitral stenosis.   4. The aortic valve was not well visualized. Aortic valve regurgitation  is not visualized. No aortic stenosis is present.   5. The inferior vena cava is dilated in size with >50% respiratory  variability, suggesting right atrial pressure of 8 mmHg.   Comparison(s): Changes from prior study are noted. The left ventricular  function is worsened.    Epic records are reviewed at length today  CHA2DS2-VASc Score = 6  The patient's score is based upon: CHF History: 1 HTN History: 1 Diabetes History: 0 Stroke History:  0 Vascular Disease History: 1 Age Score: 2 Gender Score: 1       ASSESSMENT AND PLAN: Persistent Atrial Fibrillation (ICD10:  I48.19) The patient's CHA2DS2-VASc score is 6, indicating a 9.7% annual risk of stroke.   S/p afib ablation 06/23/22, off amiodarone  Increase Toprol  to 50 mg BID, see plans below.  Continue Eliquis  5 mg BID  Secondary Hypercoagulable State (ICD10:  D68.69) The patient is at significant risk for stroke/thromboembolism based upon her CHA2DS2-VASc Score of 6.  Continue Apixaban  (Eliquis ). No bleeding issues.   SVT Reviewed with EP, ECG consistent with SVT. She is fairly asymptomatic. Will increase Toprol  to 50 mg BID. Reviewed ED precautions with her. If she goes to the ED could consider adenosine.   HTN Hold chlorthalidone to accommodate higher dose of BB.   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  CAD CAC score 399 No anginal symptoms    Follow up in the AF clinic  next week.      Myrtha Ates PA-C [facility] [address] [place], [place] [address] [phone] 08/02/2023 1:55 PM

## 2023-08-08 ENCOUNTER — Ambulatory Visit (HOSPITAL_COMMUNITY): Admitting: Physician Assistant

## 2023-08-09 ENCOUNTER — Encounter (HOSPITAL_COMMUNITY): Payer: Self-pay

## 2023-08-09 ENCOUNTER — Ambulatory Visit (HOSPITAL_COMMUNITY): Admission: RE | Admit: 2023-08-09 | Source: Ambulatory Visit | Admitting: Physician Assistant

## 2023-08-09 DIAGNOSIS — Z885 Allergy status to narcotic agent status: Secondary | ICD-10-CM | POA: Diagnosis not present

## 2023-08-09 DIAGNOSIS — I483 Typical atrial flutter: Secondary | ICD-10-CM | POA: Diagnosis not present

## 2023-08-09 DIAGNOSIS — J810 Acute pulmonary edema: Secondary | ICD-10-CM | POA: Diagnosis not present

## 2023-08-09 DIAGNOSIS — R931 Abnormal findings on diagnostic imaging of heart and coronary circulation: Secondary | ICD-10-CM | POA: Diagnosis not present

## 2023-08-09 DIAGNOSIS — R0602 Shortness of breath: Secondary | ICD-10-CM | POA: Diagnosis not present

## 2023-08-09 DIAGNOSIS — R918 Other nonspecific abnormal finding of lung field: Secondary | ICD-10-CM | POA: Diagnosis not present

## 2023-08-09 DIAGNOSIS — F17210 Nicotine dependence, cigarettes, uncomplicated: Secondary | ICD-10-CM | POA: Diagnosis not present

## 2023-08-09 DIAGNOSIS — J9601 Acute respiratory failure with hypoxia: Secondary | ICD-10-CM | POA: Diagnosis not present

## 2023-08-09 DIAGNOSIS — I110 Hypertensive heart disease with heart failure: Secondary | ICD-10-CM | POA: Diagnosis not present

## 2023-08-09 DIAGNOSIS — I080 Rheumatic disorders of both mitral and aortic valves: Secondary | ICD-10-CM | POA: Diagnosis not present

## 2023-08-09 DIAGNOSIS — I4819 Other persistent atrial fibrillation: Secondary | ICD-10-CM | POA: Diagnosis not present

## 2023-08-09 DIAGNOSIS — E785 Hyperlipidemia, unspecified: Secondary | ICD-10-CM | POA: Diagnosis not present

## 2023-08-09 DIAGNOSIS — R072 Precordial pain: Secondary | ICD-10-CM | POA: Diagnosis not present

## 2023-08-09 DIAGNOSIS — Z7901 Long term (current) use of anticoagulants: Secondary | ICD-10-CM | POA: Diagnosis not present

## 2023-08-09 DIAGNOSIS — I4439 Other atrioventricular block: Secondary | ICD-10-CM | POA: Diagnosis not present

## 2023-08-09 DIAGNOSIS — R0902 Hypoxemia: Secondary | ICD-10-CM | POA: Diagnosis not present

## 2023-08-09 DIAGNOSIS — R079 Chest pain, unspecified: Secondary | ICD-10-CM | POA: Diagnosis not present

## 2023-08-09 DIAGNOSIS — I471 Supraventricular tachycardia, unspecified: Secondary | ICD-10-CM | POA: Diagnosis not present

## 2023-08-09 DIAGNOSIS — Z79899 Other long term (current) drug therapy: Secondary | ICD-10-CM | POA: Diagnosis not present

## 2023-08-09 DIAGNOSIS — I4891 Unspecified atrial fibrillation: Secondary | ICD-10-CM | POA: Diagnosis not present

## 2023-08-09 DIAGNOSIS — I340 Nonrheumatic mitral (valve) insufficiency: Secondary | ICD-10-CM | POA: Diagnosis not present

## 2023-08-09 DIAGNOSIS — I10 Essential (primary) hypertension: Secondary | ICD-10-CM | POA: Diagnosis not present

## 2023-08-09 DIAGNOSIS — I2510 Atherosclerotic heart disease of native coronary artery without angina pectoris: Secondary | ICD-10-CM | POA: Diagnosis not present

## 2023-08-09 DIAGNOSIS — I288 Other diseases of pulmonary vessels: Secondary | ICD-10-CM | POA: Diagnosis not present

## 2023-08-09 DIAGNOSIS — I5031 Acute diastolic (congestive) heart failure: Secondary | ICD-10-CM | POA: Diagnosis not present

## 2023-08-09 DIAGNOSIS — I517 Cardiomegaly: Secondary | ICD-10-CM | POA: Diagnosis not present

## 2023-08-10 ENCOUNTER — Telehealth (HOSPITAL_BASED_OUTPATIENT_CLINIC_OR_DEPARTMENT_OTHER): Payer: Self-pay

## 2023-08-11 ENCOUNTER — Inpatient Hospital Stay (HOSPITAL_BASED_OUTPATIENT_CLINIC_OR_DEPARTMENT_OTHER): Admission: RE | Admit: 2023-08-11 | Source: Ambulatory Visit

## 2023-08-11 ENCOUNTER — Encounter (HOSPITAL_BASED_OUTPATIENT_CLINIC_OR_DEPARTMENT_OTHER): Payer: Self-pay

## 2023-08-11 DIAGNOSIS — I483 Typical atrial flutter: Secondary | ICD-10-CM | POA: Diagnosis not present

## 2023-08-11 DIAGNOSIS — J810 Acute pulmonary edema: Secondary | ICD-10-CM | POA: Diagnosis not present

## 2023-08-19 DIAGNOSIS — I480 Paroxysmal atrial fibrillation: Secondary | ICD-10-CM | POA: Diagnosis not present

## 2023-08-19 DIAGNOSIS — Z7901 Long term (current) use of anticoagulants: Secondary | ICD-10-CM | POA: Diagnosis not present

## 2023-08-19 DIAGNOSIS — I483 Typical atrial flutter: Secondary | ICD-10-CM | POA: Diagnosis not present

## 2023-08-19 DIAGNOSIS — I340 Nonrheumatic mitral (valve) insufficiency: Secondary | ICD-10-CM | POA: Diagnosis not present

## 2023-08-19 DIAGNOSIS — I10 Essential (primary) hypertension: Secondary | ICD-10-CM | POA: Diagnosis not present

## 2023-08-19 DIAGNOSIS — I5032 Chronic diastolic (congestive) heart failure: Secondary | ICD-10-CM | POA: Diagnosis not present

## 2023-08-19 DIAGNOSIS — E7800 Pure hypercholesterolemia, unspecified: Secondary | ICD-10-CM | POA: Diagnosis not present

## 2023-08-21 ENCOUNTER — Other Ambulatory Visit (HOSPITAL_COMMUNITY): Payer: Self-pay | Admitting: Physician Assistant

## 2023-08-22 DIAGNOSIS — I4891 Unspecified atrial fibrillation: Secondary | ICD-10-CM | POA: Diagnosis not present

## 2023-08-22 DIAGNOSIS — I5030 Unspecified diastolic (congestive) heart failure: Secondary | ICD-10-CM | POA: Diagnosis not present

## 2023-08-22 DIAGNOSIS — I10 Essential (primary) hypertension: Secondary | ICD-10-CM | POA: Diagnosis not present

## 2023-08-22 DIAGNOSIS — D6869 Other thrombophilia: Secondary | ICD-10-CM | POA: Diagnosis not present

## 2023-08-22 DIAGNOSIS — I4892 Unspecified atrial flutter: Secondary | ICD-10-CM | POA: Diagnosis not present

## 2023-08-24 DIAGNOSIS — I483 Typical atrial flutter: Secondary | ICD-10-CM | POA: Diagnosis not present

## 2023-08-24 DIAGNOSIS — I5032 Chronic diastolic (congestive) heart failure: Secondary | ICD-10-CM | POA: Diagnosis not present

## 2023-08-24 DIAGNOSIS — Z7901 Long term (current) use of anticoagulants: Secondary | ICD-10-CM | POA: Diagnosis not present

## 2023-08-24 DIAGNOSIS — E7800 Pure hypercholesterolemia, unspecified: Secondary | ICD-10-CM | POA: Diagnosis not present

## 2023-08-24 DIAGNOSIS — I10 Essential (primary) hypertension: Secondary | ICD-10-CM | POA: Diagnosis not present

## 2023-08-24 DIAGNOSIS — I480 Paroxysmal atrial fibrillation: Secondary | ICD-10-CM | POA: Diagnosis not present

## 2023-08-24 DIAGNOSIS — I340 Nonrheumatic mitral (valve) insufficiency: Secondary | ICD-10-CM | POA: Diagnosis not present

## 2023-08-29 DIAGNOSIS — J9600 Acute respiratory failure, unspecified whether with hypoxia or hypercapnia: Secondary | ICD-10-CM | POA: Diagnosis not present

## 2023-08-29 DIAGNOSIS — R918 Other nonspecific abnormal finding of lung field: Secondary | ICD-10-CM | POA: Diagnosis not present

## 2023-08-29 DIAGNOSIS — I4892 Unspecified atrial flutter: Secondary | ICD-10-CM | POA: Diagnosis not present

## 2023-08-29 DIAGNOSIS — J811 Chronic pulmonary edema: Secondary | ICD-10-CM | POA: Diagnosis not present

## 2023-08-29 DIAGNOSIS — R0602 Shortness of breath: Secondary | ICD-10-CM | POA: Diagnosis not present

## 2023-08-29 DIAGNOSIS — I484 Atypical atrial flutter: Secondary | ICD-10-CM | POA: Diagnosis not present

## 2023-08-29 DIAGNOSIS — J810 Acute pulmonary edema: Secondary | ICD-10-CM | POA: Diagnosis not present

## 2023-08-29 DIAGNOSIS — I4891 Unspecified atrial fibrillation: Secondary | ICD-10-CM | POA: Diagnosis not present

## 2023-08-29 DIAGNOSIS — E876 Hypokalemia: Secondary | ICD-10-CM | POA: Diagnosis not present

## 2023-08-29 DIAGNOSIS — F17210 Nicotine dependence, cigarettes, uncomplicated: Secondary | ICD-10-CM | POA: Diagnosis not present

## 2023-08-29 DIAGNOSIS — F17200 Nicotine dependence, unspecified, uncomplicated: Secondary | ICD-10-CM | POA: Diagnosis not present

## 2023-08-29 DIAGNOSIS — I5033 Acute on chronic diastolic (congestive) heart failure: Secondary | ICD-10-CM | POA: Diagnosis not present

## 2023-08-29 DIAGNOSIS — J9601 Acute respiratory failure with hypoxia: Secondary | ICD-10-CM | POA: Diagnosis not present

## 2023-08-29 DIAGNOSIS — E785 Hyperlipidemia, unspecified: Secondary | ICD-10-CM | POA: Diagnosis not present

## 2023-08-29 DIAGNOSIS — I110 Hypertensive heart disease with heart failure: Secondary | ICD-10-CM | POA: Diagnosis not present

## 2023-08-29 DIAGNOSIS — I483 Typical atrial flutter: Secondary | ICD-10-CM | POA: Diagnosis not present

## 2023-08-29 DIAGNOSIS — I340 Nonrheumatic mitral (valve) insufficiency: Secondary | ICD-10-CM | POA: Diagnosis not present

## 2023-08-29 DIAGNOSIS — I10 Essential (primary) hypertension: Secondary | ICD-10-CM | POA: Diagnosis not present

## 2023-08-29 DIAGNOSIS — Z7409 Other reduced mobility: Secondary | ICD-10-CM | POA: Diagnosis not present

## 2023-08-29 DIAGNOSIS — E8779 Other fluid overload: Secondary | ICD-10-CM | POA: Diagnosis not present

## 2023-08-29 DIAGNOSIS — I517 Cardiomegaly: Secondary | ICD-10-CM | POA: Diagnosis not present

## 2023-08-29 DIAGNOSIS — Z1152 Encounter for screening for COVID-19: Secondary | ICD-10-CM | POA: Diagnosis not present

## 2023-08-29 DIAGNOSIS — R0603 Acute respiratory distress: Secondary | ICD-10-CM | POA: Diagnosis not present

## 2023-09-01 ENCOUNTER — Ambulatory Visit (HOSPITAL_BASED_OUTPATIENT_CLINIC_OR_DEPARTMENT_OTHER)

## 2023-09-01 ENCOUNTER — Encounter (HOSPITAL_BASED_OUTPATIENT_CLINIC_OR_DEPARTMENT_OTHER): Payer: Self-pay

## 2023-09-07 DIAGNOSIS — G8929 Other chronic pain: Secondary | ICD-10-CM | POA: Diagnosis not present

## 2023-09-07 DIAGNOSIS — M25531 Pain in right wrist: Secondary | ICD-10-CM | POA: Diagnosis not present

## 2023-09-15 DIAGNOSIS — I10 Essential (primary) hypertension: Secondary | ICD-10-CM | POA: Diagnosis not present

## 2023-09-15 DIAGNOSIS — Z7901 Long term (current) use of anticoagulants: Secondary | ICD-10-CM | POA: Diagnosis not present

## 2023-09-15 DIAGNOSIS — I340 Nonrheumatic mitral (valve) insufficiency: Secondary | ICD-10-CM | POA: Diagnosis not present

## 2023-09-15 DIAGNOSIS — I5032 Chronic diastolic (congestive) heart failure: Secondary | ICD-10-CM | POA: Diagnosis not present

## 2023-09-15 DIAGNOSIS — R931 Abnormal findings on diagnostic imaging of heart and coronary circulation: Secondary | ICD-10-CM | POA: Diagnosis not present

## 2023-09-15 DIAGNOSIS — I483 Typical atrial flutter: Secondary | ICD-10-CM | POA: Diagnosis not present

## 2023-09-15 DIAGNOSIS — E7800 Pure hypercholesterolemia, unspecified: Secondary | ICD-10-CM | POA: Diagnosis not present

## 2023-09-15 DIAGNOSIS — I480 Paroxysmal atrial fibrillation: Secondary | ICD-10-CM | POA: Diagnosis not present

## 2023-09-22 ENCOUNTER — Encounter (HOSPITAL_BASED_OUTPATIENT_CLINIC_OR_DEPARTMENT_OTHER): Payer: Self-pay

## 2023-09-22 ENCOUNTER — Ambulatory Visit (HOSPITAL_BASED_OUTPATIENT_CLINIC_OR_DEPARTMENT_OTHER)
Admission: RE | Admit: 2023-09-22 | Discharge: 2023-09-22 | Disposition: A | Discharge status: Home / Self Care | Source: Ambulatory Visit | Attending: Family Medicine | Admitting: Family Medicine

## 2023-09-22 DIAGNOSIS — Z1231 Encounter for screening mammogram for malignant neoplasm of breast: Secondary | ICD-10-CM | POA: Diagnosis not present

## 2023-09-26 DIAGNOSIS — I480 Paroxysmal atrial fibrillation: Secondary | ICD-10-CM | POA: Diagnosis not present

## 2023-09-26 DIAGNOSIS — R931 Abnormal findings on diagnostic imaging of heart and coronary circulation: Secondary | ICD-10-CM | POA: Diagnosis not present

## 2023-09-26 DIAGNOSIS — I483 Typical atrial flutter: Secondary | ICD-10-CM | POA: Diagnosis not present

## 2023-09-26 DIAGNOSIS — I10 Essential (primary) hypertension: Secondary | ICD-10-CM | POA: Diagnosis not present

## 2023-09-26 DIAGNOSIS — I340 Nonrheumatic mitral (valve) insufficiency: Secondary | ICD-10-CM | POA: Diagnosis not present

## 2023-09-26 DIAGNOSIS — I5032 Chronic diastolic (congestive) heart failure: Secondary | ICD-10-CM | POA: Diagnosis not present

## 2023-10-11 DIAGNOSIS — I110 Hypertensive heart disease with heart failure: Secondary | ICD-10-CM | POA: Diagnosis not present

## 2023-10-11 DIAGNOSIS — I483 Typical atrial flutter: Secondary | ICD-10-CM | POA: Diagnosis not present

## 2023-10-11 DIAGNOSIS — I480 Paroxysmal atrial fibrillation: Secondary | ICD-10-CM | POA: Diagnosis not present

## 2023-10-11 DIAGNOSIS — Z8249 Family history of ischemic heart disease and other diseases of the circulatory system: Secondary | ICD-10-CM | POA: Diagnosis not present

## 2023-10-11 DIAGNOSIS — I5032 Chronic diastolic (congestive) heart failure: Secondary | ICD-10-CM | POA: Diagnosis not present

## 2023-10-11 DIAGNOSIS — I509 Heart failure, unspecified: Secondary | ICD-10-CM | POA: Diagnosis not present

## 2023-10-12 DIAGNOSIS — I5032 Chronic diastolic (congestive) heart failure: Secondary | ICD-10-CM | POA: Diagnosis not present

## 2023-10-12 DIAGNOSIS — I5043 Acute on chronic combined systolic (congestive) and diastolic (congestive) heart failure: Secondary | ICD-10-CM | POA: Diagnosis not present

## 2023-10-12 DIAGNOSIS — R931 Abnormal findings on diagnostic imaging of heart and coronary circulation: Secondary | ICD-10-CM | POA: Diagnosis not present

## 2023-10-12 DIAGNOSIS — I340 Nonrheumatic mitral (valve) insufficiency: Secondary | ICD-10-CM | POA: Diagnosis not present

## 2023-10-26 DIAGNOSIS — E7800 Pure hypercholesterolemia, unspecified: Secondary | ICD-10-CM | POA: Diagnosis not present

## 2023-10-26 DIAGNOSIS — I483 Typical atrial flutter: Secondary | ICD-10-CM | POA: Diagnosis not present

## 2023-10-26 DIAGNOSIS — Z7901 Long term (current) use of anticoagulants: Secondary | ICD-10-CM | POA: Diagnosis not present

## 2023-10-26 DIAGNOSIS — I10 Essential (primary) hypertension: Secondary | ICD-10-CM | POA: Diagnosis not present

## 2023-10-26 DIAGNOSIS — R001 Bradycardia, unspecified: Secondary | ICD-10-CM | POA: Diagnosis not present

## 2023-10-26 DIAGNOSIS — I5032 Chronic diastolic (congestive) heart failure: Secondary | ICD-10-CM | POA: Diagnosis not present

## 2023-10-26 DIAGNOSIS — I340 Nonrheumatic mitral (valve) insufficiency: Secondary | ICD-10-CM | POA: Diagnosis not present

## 2023-10-26 DIAGNOSIS — I480 Paroxysmal atrial fibrillation: Secondary | ICD-10-CM | POA: Diagnosis not present

## 2023-10-29 ENCOUNTER — Other Ambulatory Visit (HOSPITAL_COMMUNITY): Payer: Self-pay | Admitting: Physician Assistant

## 2023-11-03 ENCOUNTER — Ambulatory Visit (HOSPITAL_COMMUNITY): Admitting: Physician Assistant

## 2023-11-30 ENCOUNTER — Other Ambulatory Visit (HOSPITAL_COMMUNITY): Payer: Self-pay | Admitting: Physician Assistant
